# Patient Record
Sex: Male | Born: 1980 | Race: Asian | Hispanic: No | Marital: Married | Smoking: Never smoker
Health system: Southern US, Community
[De-identification: ages and names within clinical notes are randomized; demographics above are authoritative.]

## PROBLEM LIST (undated history)

## (undated) DIAGNOSIS — M1990 Unspecified osteoarthritis, unspecified site: Secondary | ICD-10-CM

## (undated) DIAGNOSIS — E785 Hyperlipidemia, unspecified: Secondary | ICD-10-CM

## (undated) DIAGNOSIS — S5290XA Unspecified fracture of unspecified forearm, initial encounter for closed fracture: Secondary | ICD-10-CM

## (undated) HISTORY — DX: Unspecified fracture of unspecified forearm, initial encounter for closed fracture: S52.90XA

## (undated) HISTORY — DX: Hyperlipidemia, unspecified: E78.5

---
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
## 2002-03-05 ENCOUNTER — Emergency Department (HOSPITAL_COMMUNITY): Admission: EM | Admit: 2002-03-05 | Discharge: 2002-03-05 | Payer: Self-pay | Admitting: Emergency Medicine

## 2007-11-12 ENCOUNTER — Encounter: Admission: RE | Admit: 2007-11-12 | Discharge: 2007-11-12 | Payer: Self-pay | Admitting: Gastroenterology

## 2009-04-04 IMAGING — US US ABDOMEN COMPLETE
1 series · 14 of 25 positions shown · non-contrast
Comparison: None

CLINICAL DATA: Abdomen pain.

ABDOMEN ULTRASOUND
TECHNIQUE: Complete abdominal ultrasound examination was performed
including evaluation of the liver, gallbladder, bile ducts,
pancreas, kidneys, spleen, IVC, and abdominal aorta.

[Series 1: us abdomen complete · 0.28mm/px · 14 of 73 slices shown]
[im 1/73]
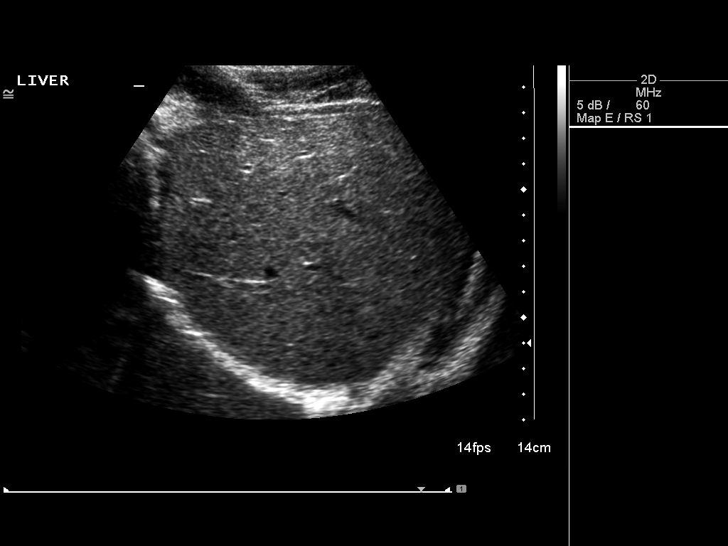
[im 7/73]
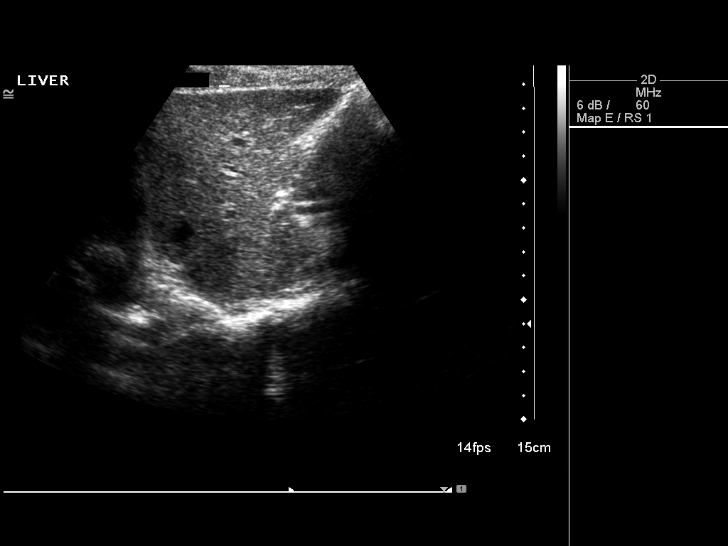
[im 13/73]
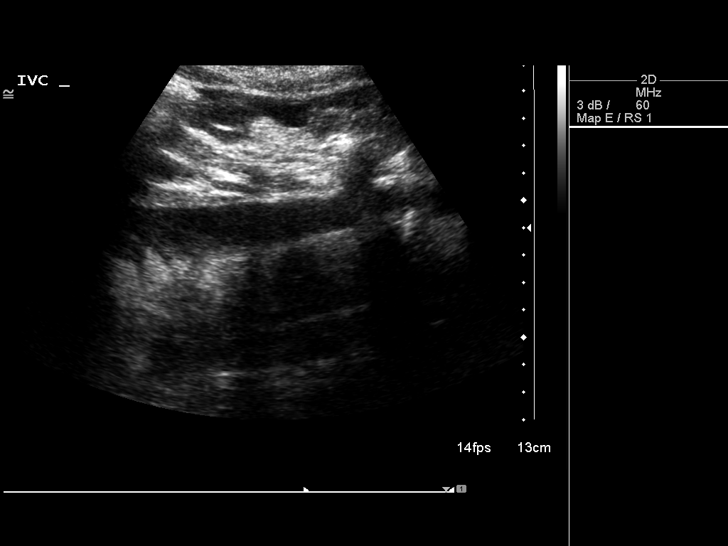
[im 19/73]
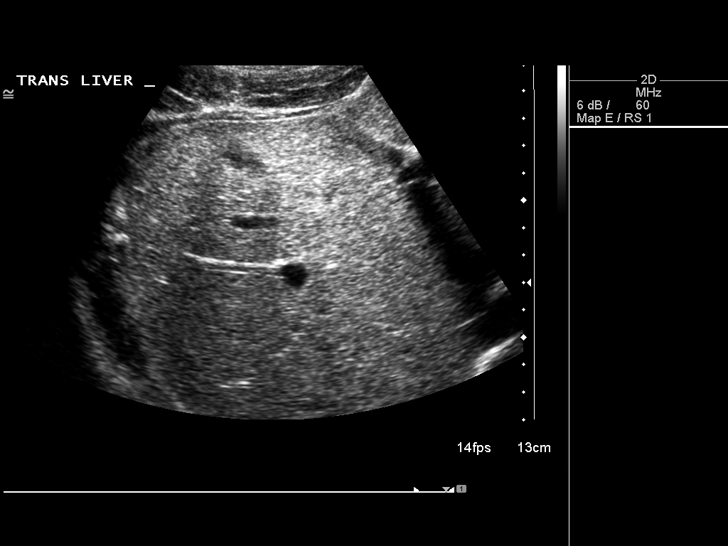
[im 25/73]
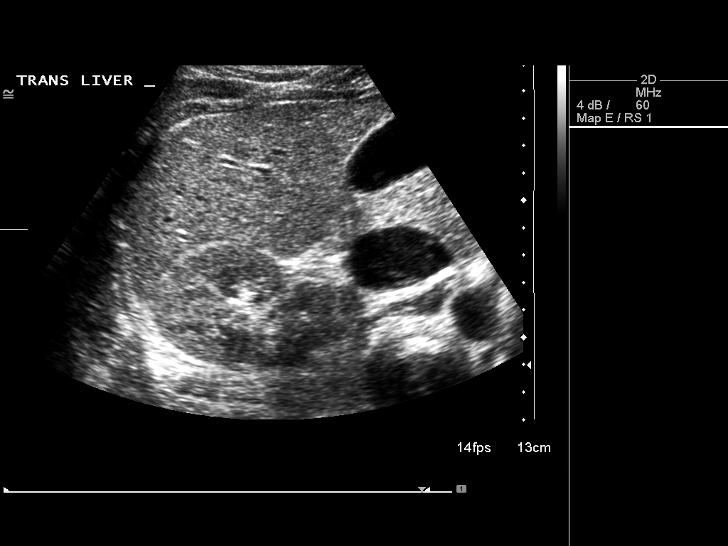
[im 28/73]
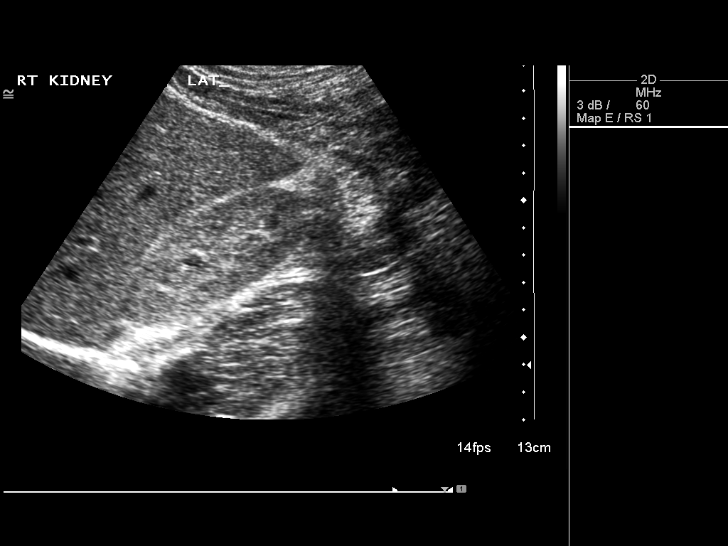
[im 34/73]
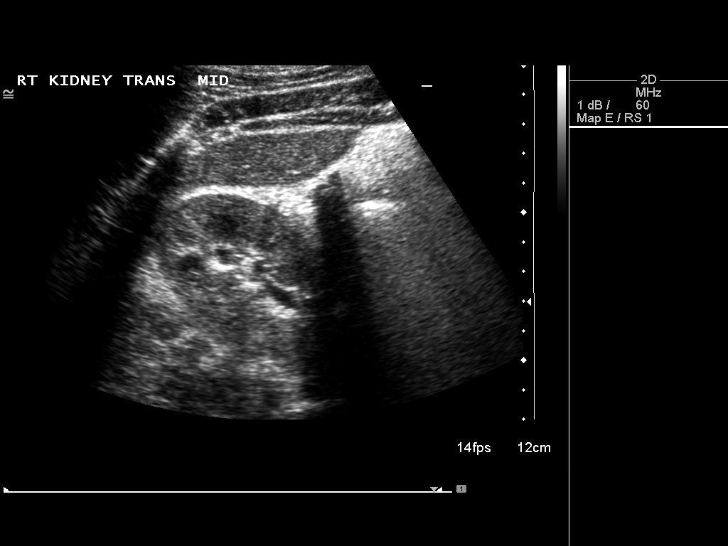
[im 40/73]
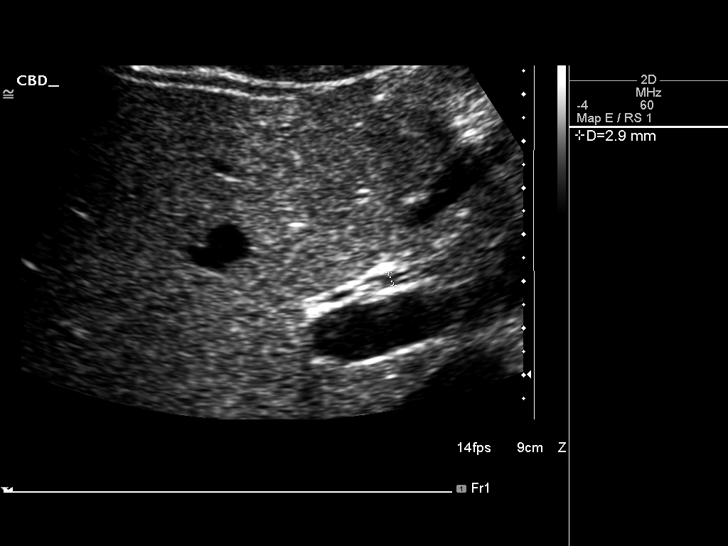
[im 46/73]
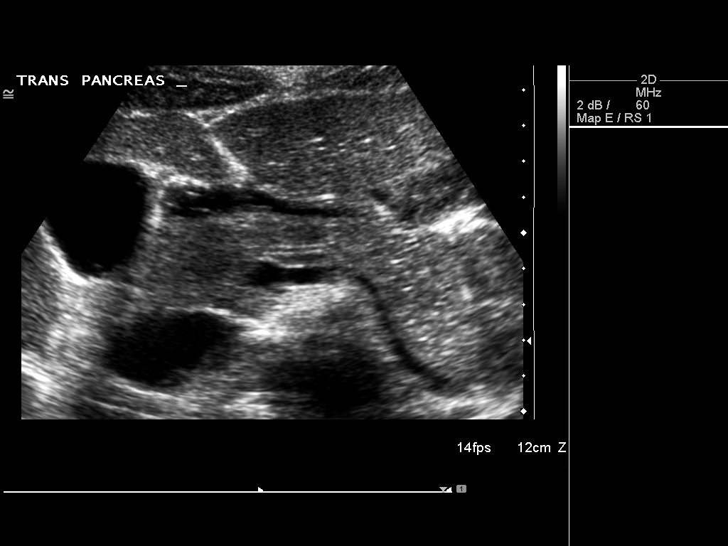
[im 49/73]
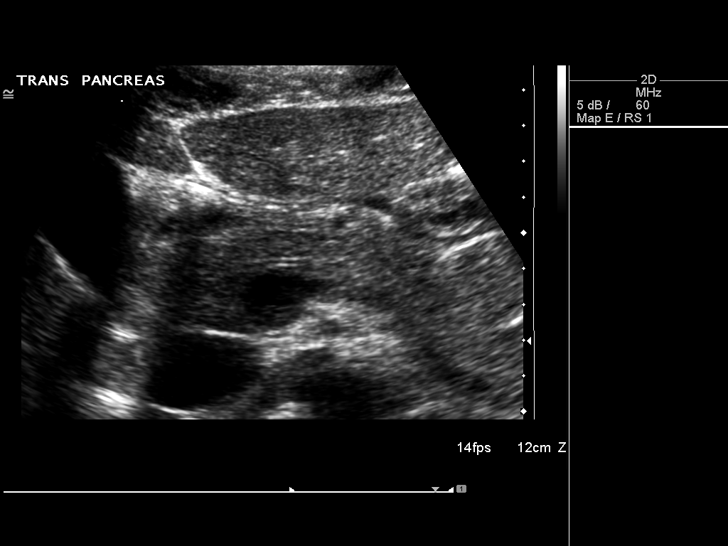
[im 55/73]
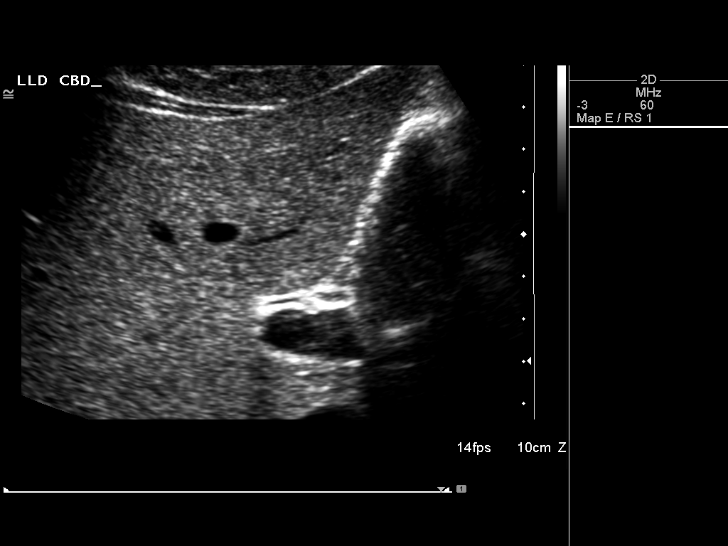
[im 61/73]
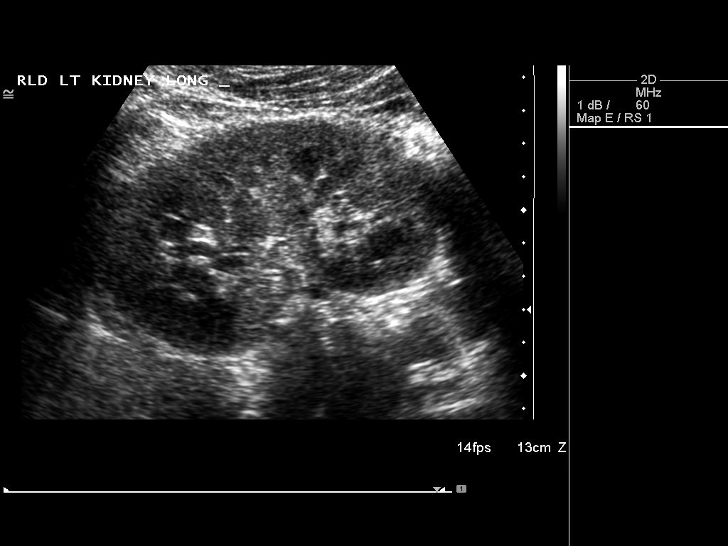
[im 67/73]
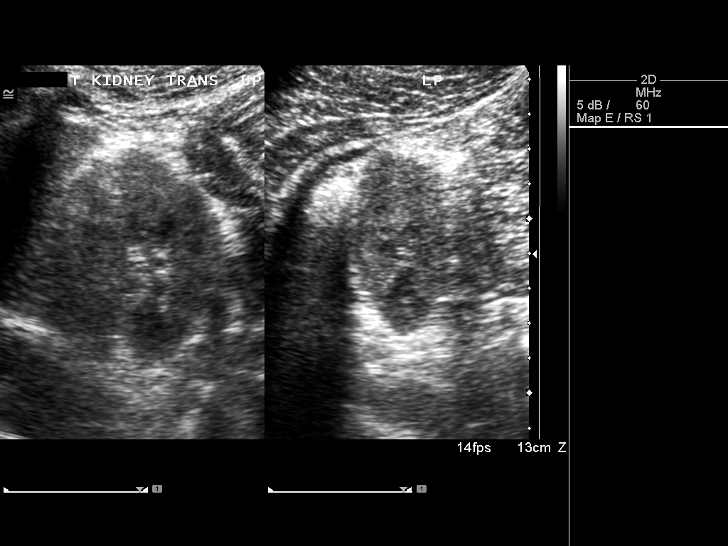
[im 73/73]
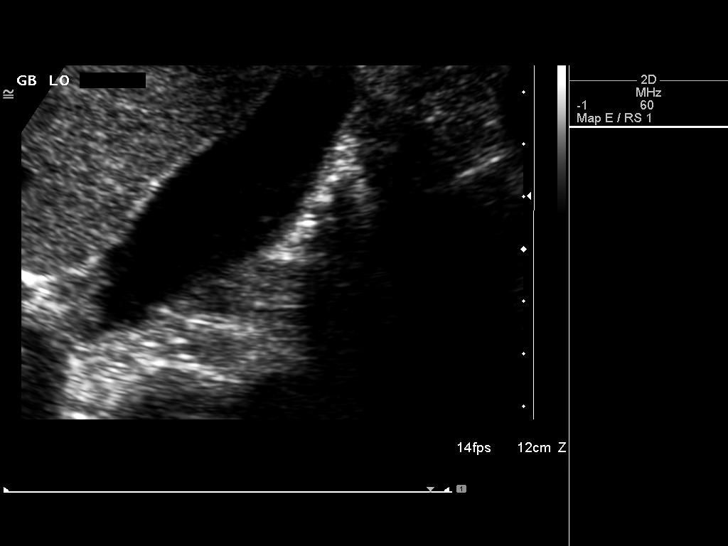

[14 of 25 positions shown; findings below may reference images not displayed]

FINDINGS: There is a 2.5 mm polyp in the gallbladder which does not
shadow or move.  Gallbladder wall is not thickened.  The common
bile duct is 2.9 mm.  The liver IVC pancreas and spleen are normal.
The kidneys and aorta normal.
IMPRESSION: Small gallbladder polyp.  No definite gallstones.

## 2014-04-07 ENCOUNTER — Ambulatory Visit (INDEPENDENT_AMBULATORY_CARE_PROVIDER_SITE_OTHER): Payer: BC Managed Care – PPO | Admitting: Urgent Care

## 2014-04-07 VITALS — BP 110/70 | HR 72 | Temp 98.1°F | Resp 16 | Ht 65.0 in | Wt 170.8 lb

## 2014-04-07 DIAGNOSIS — Z23 Encounter for immunization: Secondary | ICD-10-CM

## 2014-04-07 DIAGNOSIS — L659 Nonscarring hair loss, unspecified: Secondary | ICD-10-CM

## 2014-04-07 DIAGNOSIS — Z0000 Encounter for general adult medical examination without abnormal findings: Secondary | ICD-10-CM

## 2014-04-07 DIAGNOSIS — M79671 Pain in right foot: Secondary | ICD-10-CM

## 2014-04-07 DIAGNOSIS — M25561 Pain in right knee: Secondary | ICD-10-CM

## 2014-04-07 LAB — COMPLETE METABOLIC PANEL WITH GFR
ALK PHOS: 53 U/L (ref 39–117)
ALT: 39 U/L (ref 0–53)
AST: 24 U/L (ref 0–37)
Albumin: 4.5 g/dL (ref 3.5–5.2)
BILIRUBIN TOTAL: 0.6 mg/dL (ref 0.2–1.2)
BUN: 14 mg/dL (ref 6–23)
CO2: 27 mEq/L (ref 19–32)
Calcium: 9.8 mg/dL (ref 8.4–10.5)
Chloride: 102 mEq/L (ref 96–112)
Creat: 1.14 mg/dL (ref 0.50–1.35)
GFR, EST NON AFRICAN AMERICAN: 84 mL/min
GFR, Est African American: >89 mL/min
GLUCOSE: 85 mg/dL (ref 70–99)
Potassium: 4.3 mEq/L (ref 3.5–5.3)
SODIUM: 139 meq/L (ref 135–145)
TOTAL PROTEIN: 7.2 g/dL (ref 6.0–8.3)

## 2014-04-07 LAB — CBC
HEMATOCRIT: 47 % (ref 39.0–52.0)
HEMOGLOBIN: 16.3 g/dL (ref 13.0–17.0)
MCH: 27.5 pg (ref 26.0–34.0)
MCHC: 34.7 g/dL (ref 30.0–36.0)
MCV: 79.4 fL (ref 78.0–100.0)
Platelets: 260 10*3/uL (ref 150–400)
RBC: 5.92 MIL/uL — AB (ref 4.22–5.81)
RDW: 13.8 % (ref 11.5–15.5)
WBC: 7.3 10*3/uL (ref 4.0–10.5)

## 2014-04-07 LAB — LIPID PANEL
CHOL/HDL RATIO: 5.7 ratio
Cholesterol: 250 mg/dL — ABNORMAL HIGH (ref 0–200)
HDL: 44 mg/dL (ref >39)
LDL CALC: 173 mg/dL — AB (ref 0–99)
TRIGLYCERIDES: 165 mg/dL — AB (ref <150)
VLDL: 33 mg/dL (ref 0–40)

## 2014-04-07 LAB — TSH: TSH: 1.205 u[IU]/mL (ref 0.350–4.500)

## 2014-04-07 NOTE — Patient Instructions (Addendum)
Tdap Vaccine (Tetanus, Diphtheria, Pertussis): What You Need to Know 1. Why get vaccinated? Tetanus, diphtheria and pertussis can be very serious diseases, even for adolescents and adults. Tdap vaccine can protect us from these diseases. TETANUS (Lockjaw) causes painful muscle tightening and stiffness, usually all over the body.  It can lead to tightening of muscles in the head and neck so you can't open your mouth, swallow, or sometimes even breathe. Tetanus kills about 1 out of 5 people who are infected. DIPHTHERIA can cause a thick coating to form in the back of the throat.  It can lead to breathing problems, paralysis, heart failure, and death. PERTUSSIS (Whooping Cough) causes severe coughing spells, which can cause difficulty breathing, vomiting and disturbed sleep.  It can also lead to weight loss, incontinence, and rib fractures. Up to 2 in 100 adolescents and 5 in 100 adults with pertussis are hospitalized or have complications, which could include pneumonia or death. These diseases are caused by bacteria. Diphtheria and pertussis are spread from person to person through coughing or sneezing. Tetanus enters the body through cuts, scratches, or wounds. Before vaccines, the [place] saw as many as 200,000 cases a year of diphtheria and pertussis, and hundreds of cases of tetanus. Since vaccination began, tetanus and diphtheria have dropped by about 99% and pertussis by about 80%. 2. Tdap vaccine Tdap vaccine can protect adolescents and adults from tetanus, diphtheria, and pertussis. One dose of Tdap is routinely given at age 11 or 12. People who did not get Tdap at that age should get it as soon as possible. Tdap is especially important for health care professionals and anyone having close contact with a baby younger than 12 months. Pregnant women should get a dose of Tdap during every pregnancy, to protect the newborn from pertussis. Infants are most at risk for severe, life-threatening  complications from pertussis. A similar vaccine, called Td, protects from tetanus and diphtheria, but not pertussis. A Td booster should be given every 10 years. Tdap may be given as one of these boosters if you have not already gotten a dose. Tdap may also be given after a severe cut or burn to prevent tetanus infection. Your doctor can give you more information. Tdap may safely be given at the same time as other vaccines. 3. Some people should not get this vaccine  If you ever had a life-threatening allergic reaction after a dose of any tetanus, diphtheria, or pertussis containing vaccine, OR if you have a severe allergy to any part of this vaccine, you should not get Tdap. Tell your doctor if you have any severe allergies.  If you had a coma, or long or multiple seizures within 7 days after a childhood dose of DTP or DTaP, you should not get Tdap, unless a cause other than the vaccine was found. You can still get Td.  Talk to your doctor if you:  have epilepsy or another nervous system problem,  had severe pain or swelling after any vaccine containing diphtheria, tetanus or pertussis,  ever had Guillain-Barr Syndrome (GBS),  aren't feeling well on the day the shot is scheduled. 4. Risks of a vaccine reaction With any medicine, including vaccines, there is a chance of side effects. These are usually mild and go away on their own, but serious reactions are also possible. Brief fainting spells can follow a vaccination, leading to injuries from falling. Sitting or lying down for about 15 minutes can help prevent these. Tell your doctor if you feel   dizzy or light-headed, or have vision changes or ringing in the ears. Mild problems following Tdap (Did not interfere with activities)  Pain where the shot was given (about 3 in 4 adolescents or 2 in 3 adults)  Redness or swelling where the shot was given (about 1 person in 5)  Mild fever of at least 100.48F (up to about 1 in 25 adolescents or  1 in 100 adults)  Headache (about 3 or 4 people in 10)  Tiredness (about 1 person in 3 or 4)  Nausea, vomiting, diarrhea, stomach ache (up to 1 in 4 adolescents or 1 in 10 adults)  Chills, body aches, sore joints, rash, swollen glands (uncommon) Moderate problems following Tdap (Interfered with activities, but did not require medical attention)  Pain where the shot was given (about 1 in 5 adolescents or 1 in 100 adults)  Redness or swelling where the shot was given (up to about 1 in 16 adolescents or 1 in 25 adults)  Fever over 102F (about 1 in 100 adolescents or 1 in 250 adults)  Headache (about 3 in 20 adolescents or 1 in 10 adults)  Nausea, vomiting, diarrhea, stomach ache (up to 1 or 3 people in 100)  Swelling of the entire arm where the shot was given (up to about 3 in 100). Severe problems following Tdap (Unable to perform usual activities; required medical attention)  Swelling, severe pain, bleeding and redness in the arm where the shot was given (rare). A severe allergic reaction could occur after any vaccine (estimated less than 1 in a million doses). 5. What if there is a serious reaction? What should I look for?  Look for anything that concerns you, such as signs of a severe allergic reaction, very high fever, or behavior changes. Signs of a severe allergic reaction can include hives, swelling of the face and throat, difficulty breathing, a fast heartbeat, dizziness, and weakness. These would start a few minutes to a few hours after the vaccination. What should I do?  If you think it is a severe allergic reaction or other emergency that can't wait, call 9-1-1 or get the person to the nearest hospital. Otherwise, call your doctor.  Afterward, the reaction should be reported to the "Vaccine Adverse Event Reporting System" (VAERS). Your doctor might file this report, or you can do it yourself through the VAERS web site at www.vaers.LAgents.no, or by calling  1-409-248-7482. VAERS is only for reporting reactions. They do not give medical advice.  6. The National Vaccine Injury Compensation Program The [employer] Vaccine Injury Compensation Program (VICP) is a federal program that was created to compensate people who may have been injured by certain vaccines. Persons who believe they may have been injured by a vaccine can learn about the program and about filing a claim by calling 1-361-718-2286 or visiting the VICP website at SpiritualWord.at. 7. How can I learn more?  Ask your doctor.  Call your local or state health department.  Contact the Centers for Disease Control and Prevention (CDC):  Call 838-570-7683 or visit CDC's website at PicCapture.uy. CDC Tdap Vaccine VIS (10/30/11) Document Released: 12/09/2011 Document Revised: 10/24/2013 Document Reviewed: 09/21/2013 Exit[employer] Patient Information 2015 [place], [place]. This information is not intended to replace advice given to you by your health care provider. Make sure you discuss any questions you have with your health care provider.   Influenza Vaccine (Flu Vaccine, Inactivated or Recombinant) 2014-2015: What You Need to Know 1. Why get vaccinated? Influenza ("flu") is a contagious disease  that spreads around the [place] every winter, usually between October and May. Flu is caused by influenza viruses, and is spread mainly by coughing, sneezing, and close contact. Anyone can get flu, but the risk of getting flu is highest among children. Symptoms come on suddenly and may last several days. They can include:  fever/chills  sore throat  muscle aches  fatigue  cough  headache  runny or stuffy nose Flu can make some people much sicker than others. These people include young children, people 7165 and older, pregnant women, and people with certain health conditions-such as heart, lung or kidney disease, nervous system disorders, or a weakened immune system.  Flu vaccination is especially important for these people, and anyone in close contact with them. Flu can also lead to pneumonia, and make existing medical conditions worse. It can cause diarrhea and seizures in children. Each year thousands of people in the [place] die from flu, and many more are hospitalized. Flu vaccine is the best protection against flu and its complications. Flu vaccine also helps prevent spreading flu from person to person. 2. Inactivated and recombinant flu vaccines You are getting an injectable flu vaccine, which is either an "inactivated" or "recombinant" vaccine. These vaccines do not contain any live influenza virus. They are given by injection with a needle, and often called the "flu shot."  A different live, attenuated (weakened) influenza vaccine is sprayed into the nostrils. This vaccine is described in a separate Vaccine Information Statement. Flu vaccination is recommended every year. Some children 6 months through 608 years of age might need two doses during one year. Flu viruses are always changing. Each year's flu vaccine is made to protect against 3 or 4 viruses that are likely to cause disease that year. Flu vaccine cannot prevent all cases of flu, but it is the best defense against the disease.  It takes about 2 weeks for protection to develop after the vaccination, and protection lasts several months to a year. Some illnesses that are not caused by influenza virus are often mistaken for flu. Flu vaccine will not prevent these illnesses. It can only prevent influenza. Some inactivated flu vaccine contains a very small amount of a mercury-based preservative called thimerosal. Studies have shown that thimerosal in vaccines is not harmful, but flu vaccines that do not contain a preservative are available. 3. Some people should not get this vaccine Tell the person who gives you the vaccine:  If you have any severe, life-threatening allergies. If you ever had a  life-threatening allergic reaction after a dose of flu vaccine, or have a severe allergy to any part of this vaccine, including (for example) an allergy to gelatin, antibiotics, or eggs, you may be advised not to get vaccinated. Most, but not all, types of flu vaccine contain a small amount of egg protein.  If you ever had Guillain-Barr Syndrome (a severe paralyzing illness, also called GBS). Some people with a history of GBS should not get this vaccine. This should be discussed with your doctor.  If you are not feeling well. It is usually okay to get flu vaccine when you have a mild illness, but you might be advised to wait until you feel better. You should come back when you are better. 4. Risks of a vaccine reaction With a vaccine, like any medicine, there is a chance of side effects. These are usually mild and go away on their own. Problems that could happen after any vaccine:  Brief fainting spells  can happen after any medical procedure, including vaccination. Sitting or lying down for about 15 minutes can help prevent fainting, and injuries caused by a fall. Tell your doctor if you feel dizzy, or have vision changes or ringing in the ears.  Severe shoulder pain and reduced range of motion in the arm where a shot was given can happen, very rarely, after a vaccination.  Severe allergic reactions from a vaccine are very rare, estimated at less than 1 in a million doses. If one were to occur, it would usually be within a few minutes to a few hours after the vaccination. Mild problems following inactivated flu vaccine:  soreness, redness, or swelling where the shot was given  hoarseness  sore, red or itchy eyes  cough  fever  aches  headache  itching  fatigue If these problems occur, they usually begin soon after the shot and last 1 or 2 days. Moderate problems following inactivated flu vaccine:  Young children who get inactivated flu vaccine and pneumococcal vaccine (PCV13) at  the same time may be at increased risk for seizures caused by fever. Ask your doctor for more information. Tell your doctor if a child who is getting flu vaccine has ever had a seizure. Inactivated flu vaccine does not contain live flu virus, so you cannot get the flu from this vaccine. As with any medicine, there is a very remote chance of a vaccine causing a serious injury or death. The safety of vaccines is always being monitored. For more information, visit: http://floyd.org/ 5. What if there is a serious reaction? What should I look for?  Look for anything that concerns you, such as signs of a severe allergic reaction, very high fever, or behavior changes. Signs of a severe allergic reaction can include hives, swelling of the face and throat, difficulty breathing, a fast heartbeat, dizziness, and weakness. These would start a few minutes to a few hours after the vaccination. What should I do?  If you think it is a severe allergic reaction or other emergency that can't wait, call 9-1-1 and get the person to the nearest hospital. Otherwise, call your doctor.  Afterward, the reaction should be reported to the Vaccine Adverse Event Reporting System (VAERS). Your doctor should file this report, or you can do it yourself through the VAERS web site at www.vaers.LAgents.no, or by calling 1-727-253-8605. VAERS does not give medical advice. 6. The National Vaccine Injury Compensation Program The [employer] Vaccine Injury Compensation Program (VICP) is a federal program that was created to compensate people who may have been injured by certain vaccines. Persons who believe they may have been injured by a vaccine can learn about the program and about filing a claim by calling 1-(226)495-5955 or visiting the VICP website at SpiritualWord.at. There is a time limit to file a claim for compensation. 7. How can I learn more?  Ask your health care provider.  Call your local or state  health department.  Contact the Centers for Disease Control and Prevention (CDC):  Call 830-696-7537 (1-800-CDC-INFO) or  Visit CDC's website at BiotechRoom.com.cy CDC Vaccine Information Statement (Interim) Inactivated Influenza Vaccine (02/08/2013) Document Released: 04/03/2006 Document Revised: 10/24/2013 Document Reviewed: 05/27/2013 [facility] Patient Information 2015 [place], [place]. This information is not intended to replace advice given to you by your health care provider. Make sure you discuss any questions you have with your health care provider.    Plantar Fasciitis Plantar fasciitis is a common condition that causes foot pain. It is soreness (inflammation) of  the band of tough fibrous tissue on the bottom of the foot that runs from the heel bone (calcaneus) to the ball of the foot. The cause of this soreness may be from excessive standing, poor fitting shoes, running on hard surfaces, being overweight, having an abnormal walk, or overuse (this is common in runners) of the painful foot or feet. It is also common in aerobic exercise dancers and ballet dancers. SYMPTOMS  Most people with plantar fasciitis complain of:  Severe pain in the morning on the bottom of their foot especially when taking the first steps out of bed. This pain recedes after a few minutes of walking.  Severe pain is experienced also during walking following a long period of inactivity.  Pain is worse when walking barefoot or up stairs DIAGNOSIS   Your caregiver will diagnose this condition by examining and feeling your foot.  Special tests such as X-rays of your foot, are usually not needed. PREVENTION   Consult a sports medicine professional before beginning a new exercise program.  Walking programs offer a good workout. With walking there is a lower chance of overuse injuries common to runners. There is less impact and less jarring of the joints.  Begin all new exercise programs slowly. If problems or  pain develop, decrease the amount of time or distance until you are at a comfortable level.  Wear good shoes and replace them regularly.  Stretch your foot and the heel cords at the back of the ankle (Achilles tendon) both before and after exercise.  Run or exercise on even surfaces that are not hard. For example, asphalt is better than pavement.  Do not run barefoot on hard surfaces.  If using a treadmill, vary the incline.  Do not continue to workout if you have foot or joint problems. Seek professional help if they do not improve. HOME CARE INSTRUCTIONS   Avoid activities that cause you pain until you recover.  Use ice or cold packs on the problem or painful areas after working out.  Only take over-the-counter or prescription medicines for pain, discomfort, or fever as directed by your caregiver.  Soft shoe inserts or athletic shoes with air or gel sole cushions may be helpful.  If problems continue or become more severe, consult a sports medicine caregiver or your own health care provider. Cortisone is a potent anti-inflammatory medication that may be injected into the painful area. You can discuss this treatment with your caregiver. MAKE SURE YOU:   Understand these instructions.  Will watch your condition.  Will get help right away if you are not doing well or get worse. Document Released: 03/04/2001 Document Revised: 09/01/2011 Document Reviewed: 05/03/2008 [facility] Patient Information 2015 [place]. This information is not intended to replace advice given to you by your health care provider. Make sure you discuss any questions you have with your health care provider.

## 2014-04-07 NOTE — Progress Notes (Signed)
Discussed patient, PE, A/P with [name]. Agree.

## 2014-04-07 NOTE — Progress Notes (Signed)
Subjective:    Patient ID: Parker Rogers, male    DOB: 08-Mar-1981, 33 y.o.   MRN: [id number]  HPI  Parker Rogers is presenting as a new patient today to establish care at [facility]. Patient states that he is in very good health, does not have any chronic conditions and is not on any medications. He has not had an annual exam in several years. Does not wear glasses or contacts, no visual deficits, has not needed eye care. He does get dental cleanings yearly, last visit about 1 year ago. Due for another visit.  Immunizations: Plans on updating his TDap and getting flu vaccine today. Otherwise, patient states he is up to date.  Concerns for today: Hair thinning - states that his his hair has been thinning for the past several weeks. Denies hair loss, receeding hairline or bald spots. He has previously never taken anything for his hair thinning. Denies any family history of male-pattern baldness. ROS as below. He would like to have recommendations regarding medications to prevent baldness.   Knee and heel pain - since May 2015, he has had right heel pain worse in the morning and after running long distances. He has had this heel pain before in his left foot, resolved with stretching and icing after 2 months. Currently uses soles in his shoes but does not feel any significant relief from his heel pain. He also has intermittent right knee pain after strenuous running including sprinting and long distance. Usually resolves after a few days. No swelling or erythema around joint. Denies decreased ROM, sensation, strength for knee or heel. ROS as below. No other aggravating or relieving factors.  Denies any current medications.  No known food or drug allergies.   Past Medical History  Diagnosis Date  . Fracture of forearm, closed 1999    left arm    Past Surgical History  Procedure Laterality Date  . Fracture surgery  1999    Left arm  . Closed reduction forearm fracture  1999    left forearm     History   Social History  . Marital Status: Married    Spouse Name: N/A    Number of Children: N/A  . Years of Education: N/A   Occupational History  . Not on file.   Social History Main Topics  . Smoking status: Never Smoker   . Smokeless tobacco: Not on file  . Alcohol Use: 0.5 oz/week    1 drink(s) per week     Comment: socially  . Drug Use: No  . Sexual Activity: Yes   Other Topics Concern  . Not on file   Social History Narrative   Married 02/2014, just came back from his honeymoon. No children yet. 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 Eats healthy and exercises regularly.    Review of Systems  Constitutional: Negative for fever, chills, fatigue and unexpected weight change.  HENT: Negative for ear pain, hearing loss, sore throat and trouble swallowing.   Eyes: Negative for pain and visual disturbance.  Respiratory: Negative for cough, chest tightness and shortness of breath.   Cardiovascular: Negative for chest pain and palpitations.  Gastrointestinal: Negative for nausea, vomiting, diarrhea, constipation and blood in stool.  Endocrine: Negative for polydipsia, polyphagia and polyuria.  Genitourinary: Negative for dysuria, hematuria, flank pain, scrotal swelling, difficulty urinating and testicular pain.  Musculoskeletal: Positive for arthralgias (as in HPI). Negative for back pain.  Skin: Negative for rash.  Allergic/Immunologic:  Negative for food allergies.  Neurological: Negative for dizziness, light-headedness and headaches.  Psychiatric/Behavioral: Negative for sleep disturbance and dysphoric mood.      Objective:   Physical Exam  Constitutional:  BP 110/70  Pulse 72  Temp(Src) 98.1 F (36.7 C) (Oral)  Resp 16  Ht 5\' 5"  (1.651 m)  Wt 170 lb 12.8 oz (77.474 kg)  BMI 28.42 kg/m2  SpO2 100%    BP 110/70  Pulse 72  Temp(Src) 98.1 F (36.7 C) (Oral)  Resp 16  Ht 5\' 5"  (1.651 m)  Wt 170 lb 12.8 oz  (77.474 kg)  BMI 28.42 kg/m2  SpO2 100%  General Appearance:    Alert, cooperative, no distress, appears stated age  Head:    Normocephalic, without obvious abnormality, atraumatic  Eyes:    PERRL, conjunctiva/corneas clear, EOM's intact, fundi    benign, both eyes       Ears:    Normal TM's and external ear canals, both ears  Nose:   Nares normal, septum midline, mucosa normal, no drainage    or sinus tenderness  Throat:   Lips, mucosa, and tongue normal; teeth and gums normal  Neck:   Supple, symmetrical, trachea midline, no adenopathy;       thyroid:  No enlargement/tenderness/nodules; no carotid   bruit or JVD  Back:     Symmetric, no curvature, ROM normal, no CVA tenderness  Lungs:     Clear to auscultation bilaterally, respirations unlabored  Chest wall:    No tenderness or deformity  Heart:    Regular rate and rhythm, S1 and S2 normal, no murmur, rub   or gallop  Abdomen:     Soft, non-tender, bowel sounds active all four quadrants,    no masses, no organomegaly  Genitalia:    Normal male without lesion, discharge or tenderness  Rectal:    Patient declined.  Extremities:   Extremities normal, atraumatic, no cyanosis or edema  Pulses:   2+ and symmetric all extremities  Skin:   Skin color, texture, turgor normal, no rashes or lesions  Lymph nodes:   Cervical, supraclavicular, and axillary nodes normal  Neurologic:   CNII-XII intact. Normal strength, sensation and reflexes      throughout       Assessment & Plan:   Parker Rogers is a 33 y.o. male presenting as a new patient to establish care. Patient is a young male, recently married, in excellent health. Discussed healthy lifestyle, diet, exercise, preventative care, vaccinations, and addressed his concerns. Plan for follow up in 1 year. Otherwise, plan for specific conditions below.  1. Physical exam, annual Baseline labs today - COMPLETE METABOLIC PANEL WITH GFR - CBC - Lipid panel - TSH  2. Hair thinning No obvious  abnormalities found on PE, TSH level today, advised patient that he could use OTC Rogaine, monitor  3. Heel pain, right 4. Right knee pain Likely due to plantar fascitis and overuse, Rx heel splint for 4-6 weeks nightly, Alleve for pain, advised rest, ice (immediately after exercise), compression and elevation, switch from running to biking or swimming for exercise until symptoms resolve, follow up if pain worsens or no resolution  5. Flu vaccine need 6. Need for diphtheria-tetanus-pertussis (Tdap) vaccine, adult/adolescent - Flu Vaccine QUAD 36+ mos IM - Tdap vaccine greater than or equal to 7yo IM   Wallis BambergMario Rukaya Kleinschmidt, PA-C [facility] [phone] 04/07/2014 11:44 AM

## 2014-04-11 ENCOUNTER — Telehealth: Payer: Self-pay | Admitting: Urgent Care

## 2014-04-11 NOTE — Telephone Encounter (Signed)
Attempted phone call to patient's cell phone. Also requested that patient set up mychart account to forward results more efficiently. Will call again at a later time.  Wallis BambergMario Mumtaz Lovins, [place]-C 8:43 AM 04/11/2014

## 2014-04-12 ENCOUNTER — Telehealth: Payer: Self-pay | Admitting: Urgent Care

## 2014-04-12 ENCOUNTER — Encounter: Payer: Self-pay | Admitting: Urgent Care

## 2014-04-12 NOTE — Telephone Encounter (Signed)
Discussed lab results with patient including dietary modifications and continued exercise. Will hold off on starting cholesterol lowering medications. Recheck lipid panel at next annual physical exam.

## 2014-06-23 HISTORY — PX: WISDOM TOOTH EXTRACTION: SHX21

## 2014-10-14 ENCOUNTER — Ambulatory Visit (INDEPENDENT_AMBULATORY_CARE_PROVIDER_SITE_OTHER): Payer: BLUE CROSS/BLUE SHIELD | Admitting: Emergency Medicine

## 2014-10-14 VITALS — BP 102/70 | HR 57 | Temp 98.0°F | Resp 14 | Ht 66.0 in | Wt 170.0 lb

## 2014-10-14 DIAGNOSIS — H0016 Chalazion left eye, unspecified eyelid: Secondary | ICD-10-CM | POA: Diagnosis not present

## 2014-10-14 MED ORDER — POLYMYXIN B-TRIMETHOPRIM 10000-0.1 UNIT/ML-% OP SOLN: 2.0000 [drp] | OPHTHALMIC | Status: DC

## 2014-10-14 NOTE — Progress Notes (Signed)
[facility] [address], [place] [id number] [phone]  Date:  10/14/2014   Name:  Parker Rogers   DOB:  06/05/1981   MRN:  [id number]  PCP:  No PCP Per Patient    Chief Complaint: Eye Problem   History of Present Illness:  Parker Rogers is a 34 y.o. very pleasant male patient who presents with the following:  Noticed swelling in left upper lid associated with itching on Thursday. No discharge or gluing No FB sensation No injury or vision symptoms No coryza No fever or chills No improvement with over the counter medications or other home remedies.  Denies other complaint or health concern today.    There are no active problems to display for this patient.   Past Medical History  Diagnosis Date  . Fracture of forearm, closed 1999    left arm    Past Surgical History  Procedure Laterality Date  . Fracture surgery  1999    Left arm  . Closed reduction forearm fracture  1999    left forearm    History  Substance Use Topics  . Smoking status: Never Smoker   . Smokeless tobacco: Not on file  . Alcohol Use: 0.5 oz/week    1 drink(s) per week     Comment: socially    Family History  Problem Relation Age of Onset  . Hyperlipidemia Father     No Known Allergies  Medication list has been reviewed and updated.  No current outpatient prescriptions on file prior to visit.   No current facility-administered medications on file prior to visit.    Review of Systems:  As per HPI, otherwise negative.    Physical Examination: Filed Vitals:   10/14/14 0925  BP: 102/70  Pulse: 57  Temp: 98 F (36.7 C)  Resp: 14   Filed Vitals:   10/14/14 0925  Height: 5\' 6"  (1.676 m)  Weight: 170 lb (77.111 kg)   Body mass index is 27.45 kg/(m^2). Ideal Body Weight: Weight in (lb) to have BMI = 25: 154.6   GEN: WDWN, NAD, Non-toxic, Alert & Oriented x 3 HEENT: Atraumatic, Normocephalic.  Ears and Nose: No external deformity. EXTR: No  clubbing/cyanosis/edema NEURO: Normal gait.  PSYCH: Normally interactive. Conversant. Not depressed or anxious appearing.  Calm demeanor.  LEFT eye;  Swelling upper lid.  Unable to evert. PRRERLA EOMI no injection.  No FB   Assessment and Plan: Chalazion ophth trimeth drops  Signed,  Phillips OdorJeffery Anderson, MD

## 2014-10-14 NOTE — Patient Instructions (Signed)
Chalazion A chalazion is a swelling or hard lump on the eyelid caused by a blocked oil gland. Chalazions may occur on the upper or the lower eyelid.  CAUSES  Oil gland in the eyelid becomes blocked. SYMPTOMS   Swelling or hard lump on the eyelid. This lump may make it hard to see out of the eye.  The swelling may spread to areas around the eye. TREATMENT   Although some chalazions disappear by themselves in 1 or 2 months, some chalazions may need to be removed.  Medicines to treat an infection may be required. HOME CARE INSTRUCTIONS   Wash your hands often and dry them with a clean towel. Do not touch the chalazion.  Apply heat to the eyelid several times a day for 10 minutes to help ease discomfort and bring any yellowish white fluid (pus) to the surface. One way to apply heat to a chalazion is to use the handle of a metal spoon.  Hold the handle under hot water until it is hot, and then wrap the handle in paper towels so that the heat can come through without burning your skin.  Hold the wrapped handle against the chalazion and reheat the spoon handle as needed.  Apply heat in this fashion for 10 minutes, 4 times per day.  Return to your caregiver to have the pus removed if it does not break (rupture) on its own.  Do not try to remove the pus yourself by squeezing the chalazion or sticking it with a pin or needle.  Only take over-the-counter or prescription medicines for pain, discomfort, or fever as directed by your caregiver. SEEK IMMEDIATE MEDICAL CARE IF:   You have pain in your eye.  Your vision changes.  The chalazion does not go away.  The chalazion becomes painful, red, or swollen, grows larger, or does not start to disappear after 2 weeks. MAKE SURE YOU:   Understand these instructions.  Will watch your condition.  Will get help right away if you are not doing well or get worse. Document Released: 06/06/2000 Document Revised: 09/01/2011 Document Reviewed:  09/24/2009 ExitCare Patient Information 2015 [employer]. This information is not intended to replace advice given to you by your health care provider. Make sure you discuss any questions you have with your health care provider.  

## 2014-10-23 ENCOUNTER — Telehealth: Payer: Self-pay

## 2014-10-23 NOTE — Telephone Encounter (Signed)
Patient wants a return call from referrals about his upcoming appointment.  [phone]

## 2015-03-13 ENCOUNTER — Encounter: Payer: BLUE CROSS/BLUE SHIELD | Admitting: Family Medicine

## 2015-04-11 ENCOUNTER — Encounter: Payer: Self-pay | Admitting: Urgent Care

## 2015-04-11 ENCOUNTER — Ambulatory Visit (INDEPENDENT_AMBULATORY_CARE_PROVIDER_SITE_OTHER): Payer: BLUE CROSS/BLUE SHIELD | Admitting: Urgent Care

## 2015-04-11 VITALS — BP 110/73 | HR 62 | Temp 98.7°F | Resp 16 | Ht 65.75 in | Wt 166.4 lb

## 2015-04-11 DIAGNOSIS — Z0000 Encounter for general adult medical examination without abnormal findings: Secondary | ICD-10-CM | POA: Diagnosis not present

## 2015-04-11 LAB — CBC
HCT: 47.8 % (ref 39.0–52.0)
HEMOGLOBIN: 16.6 g/dL (ref 13.0–17.0)
MCH: 28.3 pg (ref 26.0–34.0)
MCHC: 34.7 g/dL (ref 30.0–36.0)
MCV: 81.6 fL (ref 78.0–100.0)
MPV: 10.3 fL (ref 8.6–12.4)
PLATELETS: 254 10*3/uL (ref 150–400)
RBC: 5.86 MIL/uL — AB (ref 4.22–5.81)
RDW: 13.5 % (ref 11.5–15.5)
WBC: 5.2 10*3/uL (ref 4.0–10.5)

## 2015-04-11 LAB — COMPREHENSIVE METABOLIC PANEL
ALBUMIN: 4.7 g/dL (ref 3.6–5.1)
ALT: 34 U/L (ref 9–46)
AST: 25 U/L (ref 10–40)
Alkaline Phosphatase: 54 U/L (ref 40–115)
BILIRUBIN TOTAL: 0.8 mg/dL (ref 0.2–1.2)
BUN: 18 mg/dL (ref 7–25)
CALCIUM: 9.8 mg/dL (ref 8.6–10.3)
CHLORIDE: 100 mmol/L (ref 98–110)
CO2: 30 mmol/L (ref 20–31)
Creat: 1.21 mg/dL (ref 0.60–1.35)
GLUCOSE: 85 mg/dL (ref 65–99)
POTASSIUM: 4.9 mmol/L (ref 3.5–5.3)
Sodium: 138 mmol/L (ref 135–146)
Total Protein: 7.4 g/dL (ref 6.1–8.1)

## 2015-04-11 LAB — LIPID PANEL
CHOL/HDL RATIO: 4.4 ratio (ref <=5.0)
Cholesterol: 213 mg/dL — ABNORMAL HIGH (ref 125–200)
HDL: 48 mg/dL (ref >=40)
LDL CALC: 131 mg/dL — AB (ref <130)
TRIGLYCERIDES: 172 mg/dL — AB (ref <150)
VLDL: 34 mg/dL — ABNORMAL HIGH (ref <30)

## 2015-04-11 LAB — TSH: TSH: 1.228 u[IU]/mL (ref 0.350–4.500)

## 2015-04-11 NOTE — Patient Instructions (Signed)

## 2015-04-11 NOTE — Progress Notes (Signed)
MRN: [id number]  Subjective:   Parker Rogers is a 34 y.o. male presenting for annual physical exam.  Medical care team includes: PCP: No PCP Per Patient  Specialists: None   Yaniv does not have any active problems on his problem list.  [name] is married since 2015, just had his anniversary 02/2015, went hiking and enjoys doing outdoor activities. Plans on mountain biking more often. He works as an Art gallery manager. Life is good at home and work. Eats healthily, denies smoking cigarettes or drinking alcohol.  Rithwik has a current medication list which includes the following prescription(s): glucosamine hcl, multivitamin, and fish oil. He has No Known Allergies.  Kaicen  has a past medical history of Fracture of forearm, closed (1999). Also  has past surgical history that includes Closed reduction forearm fracture (1999); Fracture surgery (1999); and Wisdom tooth extraction (2016).  His family history includes Hyperlipidemia in his father.  Immunizations: Flu shot updated this year through his work, TDAP 03/2014  Review of Systems  Constitutional: Negative for fever, chills, weight loss, malaise/fatigue and diaphoresis.  HENT: Negative for congestion, ear discharge, ear pain, hearing loss, nosebleeds, sore throat and tinnitus.   Eyes: Negative for blurred vision, double vision, photophobia, pain, discharge and redness.  Respiratory: Negative for cough, shortness of breath and wheezing.   Cardiovascular: Negative for chest pain, palpitations and leg swelling.  Gastrointestinal: Negative for nausea, vomiting, abdominal pain, diarrhea, constipation and blood in stool.  Genitourinary: Negative for dysuria, urgency, frequency, hematuria and flank pain.  Musculoskeletal: Negative for myalgias, back pain and joint pain.  Skin: Negative for itching and rash.  Neurological: Negative for dizziness, tingling, seizures, loss of consciousness, weakness and headaches.  Endo/Heme/Allergies:  Negative for polydipsia.  Psychiatric/Behavioral: Negative for depression, suicidal ideas, hallucinations, memory loss and substance abuse. The patient is not nervous/anxious and does not have insomnia.    Objective:   Vitals: BP 110/73 mmHg  Pulse 62  Temp(Src) 98.7 F (37.1 C) (Oral)  Resp 16  Ht 5' 5.75" (1.67 m)  Wt 166 lb 6.4 oz (75.479 kg)  BMI 27.06 kg/m2  Physical Exam  Constitutional: He is oriented to person, place, and time. He appears well-developed and well-nourished.  HENT:  TM's intact bilaterally, no effusions or erythema. Nares patent, nasal turbinates pink and moist, nasal passages patent. No sinus tenderness. Oropharynx clear, mucous membranes moist, dentition in good repair.  Eyes: Conjunctivae and EOM are normal. Pupils are equal, round, and reactive to light. Right eye exhibits no discharge. Left eye exhibits no discharge. No scleral icterus.  Neck: Normal range of motion. Neck supple. No thyromegaly present.  Cardiovascular: Normal rate, regular rhythm and intact distal pulses.  Exam reveals no gallop and no friction rub.   No murmur heard. Pulmonary/Chest: No stridor. No respiratory distress. He has no wheezes. He has no rales.  Abdominal: Soft. Bowel sounds are normal. He exhibits no distension and no mass. There is no tenderness.  Musculoskeletal: Normal range of motion. He exhibits no edema or tenderness.  Lymphadenopathy:    He has no cervical adenopathy.  Neurological: He is alert and oriented to person, place, and time.  Skin: Skin is warm and dry. No rash noted. No erythema. No pallor.  Psychiatric: He has a normal mood and affect.   Assessment and Plan :   1. Annual physical exam - Labs pending - Patient is medically healthy - Discussed healthy lifestyle, diet, exercise, preventative care, vaccinations, and addressed patient's concerns.   Marquita Palms  Leandrew KoyanagiMani, PA-C [facility] [phone] 04/11/2015  8:06  AM

## 2015-04-18 ENCOUNTER — Telehealth: Payer: Self-pay

## 2015-04-18 NOTE — Telephone Encounter (Signed)
The patient called to ask a few clinical questions regarding his last office visit with Gurney MaxinMike Mani.  He also wants to discuss his labwork.  He was also interested in receiving a referral for his headaches, but wasn't sure what kind of specialist would be best.  Please advise, thank you.  I confirmed the patient's preferred phone number, which is listed below.  CB#: [phone]

## 2015-04-19 NOTE — Telephone Encounter (Signed)
Having headaches for over a month. Intermittent, dull headaches, mild in nature. No photosensitivity, tinnitus, numbness or tingling, eye pain, ear pain, congestion. Patient has not tried any medicines. States that he will try NSAIDs, hydrating, sleeping well and eating regular balanced meals. If no improvement in 1 week, he will call. We can proceed with referral to Neuro at that point.

## 2015-04-19 NOTE — Telephone Encounter (Signed)
Pt had a question regarding labs for Hep C and HIV, he stated that Kathlene NovemberMike had mention this to him at his appointment. Pt advised that labs were not drawn on these test. He stated he does not feel he wants this done at this time. He does want a referral for headaches. Pt advised message will be given to Kathlene NovemberMike and he will be called when referral is made.

## 2015-07-12 ENCOUNTER — Ambulatory Visit (INDEPENDENT_AMBULATORY_CARE_PROVIDER_SITE_OTHER): Payer: BLUE CROSS/BLUE SHIELD | Admitting: Family Medicine

## 2015-07-12 ENCOUNTER — Encounter: Payer: Self-pay | Admitting: Family Medicine

## 2015-07-12 VITALS — BP 123/83 | HR 57 | Temp 98.0°F | Resp 16 | Ht 66.0 in | Wt 174.0 lb

## 2015-07-12 DIAGNOSIS — J3089 Other allergic rhinitis: Secondary | ICD-10-CM

## 2015-07-12 DIAGNOSIS — Z7189 Other specified counseling: Secondary | ICD-10-CM

## 2015-07-12 DIAGNOSIS — K219 Gastro-esophageal reflux disease without esophagitis: Secondary | ICD-10-CM | POA: Diagnosis not present

## 2015-07-12 DIAGNOSIS — Z7689 Persons encountering health services in other specified circumstances: Secondary | ICD-10-CM

## 2015-07-12 MED ORDER — FLUTICASONE PROPIONATE 50 MCG/ACT NA SUSP
2.0000 | Freq: Every day | NASAL | Status: DC
Start: 2015-07-12 — End: 2021-06-20

## 2015-07-12 MED ORDER — RANITIDINE HCL 150 MG PO CAPS: 150.0000 mg | ORAL_CAPSULE | Freq: Every evening | ORAL | Status: DC

## 2015-07-12 MED ORDER — LORATADINE 10 MG PO TABS: 10.0000 mg | ORAL_TABLET | Freq: Every day | ORAL | Status: DC

## 2015-07-12 NOTE — Patient Instructions (Signed)
I think your throat pain and headaches could be related to sinus drainage. We will try flonase and an over the counter allergy pill to see if this helps your symptoms.  Your symptoms could also be related to acid reflux- take Ranitdine once daily to help with this.   If you have trouble swallowing, breathing, or feel your throat is closing- please seek immediate medical attention.

## 2015-07-12 NOTE — Progress Notes (Signed)
Subjective:    Patient ID: Parker Rogers, male    DOB: [birth year], 35 y.o.   MRN: [id number]  HPI: Parker Rogers is a 35 y.o. male presenting on 07/12/2015 for Establish Care   HPI  Pt presents to establish care today. Previous care provider was [facility].  It has been 3 months months since His last PCP visit. Records from previous provider will be requested and reviewed. Current medical problems include:  High cholesterol: Found on physical.  Has made lifestyle changes. Takes fish oil.  R sided throat pain: Uncomfortable when he swallows. Present for a few weeks. No recent URI. No issues with eating. Has not tried losenges. No sensation of lump in the throat. No dysphagia, no acid reflux. Previous issues with heart burn in the past.  Headaches: Was seen at urgent care- told he was dehydrated. He gets headaches after salty  greasy food. Thinks it might be related to MSG. Headaches- have no particular spot. Would not go away with tylenol. Goes away with drinking water or spicy food.   Health maintenance:  Last TDAP- 2015 Flu up to date.    Past Medical History  Diagnosis Date  . Fracture of forearm, closed 1999    left arm  . Hyperlipidemia    Social History   Social History  . Marital Status: Married    Spouse Name: N/A  . Number of Children: N/A  . Years of Education: N/A   Occupational History  . Forensic scientist    Social History Main Topics  . Smoking status: Never Smoker   . Smokeless tobacco: Not on file  . Alcohol Use: 0.5 oz/week    1 Standard drinks or equivalent per week     Comment: socially  . Drug Use: No  . Sexual Activity: Yes   Other Topics Concern  . Not on file   Social History Narrative   Married 02/2014, just came back from his honeymoon. No children yet. 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 Eats healthy and exercises regularly.   Family History  Problem Relation Age of Onset  .  Hyperlipidemia Father    Current Outpatient Prescriptions on File Prior to Visit  Medication Sig  . Glucosamine HCl (GLUCOSAMINE PO) Take by mouth daily.  . Multiple Vitamin (MULTIVITAMIN) tablet Take 1 tablet by mouth daily.  . Omega-3 Fatty Acids (FISH OIL) 1000 MG CAPS Take by mouth daily.   No current facility-administered medications on file prior to visit.    Review of Systems  Constitutional: Negative for fever and chills.  HENT: Positive for sore throat.   Respiratory: Negative for chest tightness, shortness of breath and wheezing.   Cardiovascular: Negative for chest pain, palpitations and leg swelling.  Gastrointestinal: Negative for nausea, vomiting and abdominal pain.  Endocrine: Negative.   Genitourinary: Negative for dysuria, urgency, discharge, penile pain and testicular pain.  Musculoskeletal: Negative for back pain, joint swelling and arthralgias.  Skin: Negative.   Neurological: Positive for headaches. Negative for dizziness, weakness and numbness.  Psychiatric/Behavioral: Negative for sleep disturbance and dysphoric mood.   Per HPI unless specifically indicated above     Objective:    BP 123/83 mmHg  Pulse 57  Temp(Src) 98 F (36.7 C) (Oral)  Resp 16  Ht  (1.676 m)  Wt 174 lb (78.926 kg)  BMI 28.10 kg/m2  Wt Readings from Last 3 Encounters:  07/12/15 174 lb (78.926 kg)  04/11/15 166 lb 6.4  oz (75.479 kg)  10/14/14 170 lb (77.111 kg)    Physical Exam  Constitutional: He is oriented to person, place, and time. He appears well-developed and well-nourished. No distress.  HENT:  Head: Normocephalic and atraumatic.  Right Ear: Hearing and tympanic membrane normal.  Left Ear: Hearing and tympanic membrane normal.  Nose: No mucosal edema or rhinorrhea. Right sinus exhibits no maxillary sinus tenderness and no frontal sinus tenderness. Left sinus exhibits no maxillary sinus tenderness and no frontal sinus tenderness.  Mouth/Throat: Uvula is midline and  mucous membranes are normal. Posterior oropharyngeal erythema (mild throat irriation. ) present.  Neck: Neck supple. No thyromegaly present.  Cardiovascular: Normal rate, regular rhythm and normal heart sounds.  Exam reveals no gallop and no friction rub.   No murmur heard. Pulmonary/Chest: Effort normal and breath sounds normal. He has no wheezes.  Abdominal: Soft. Bowel sounds are normal. He exhibits no distension. There is no tenderness. There is no rebound.  Musculoskeletal: Normal range of motion. He exhibits no edema or tenderness.  Neurological: He is alert and oriented to person, place, and time. He has normal reflexes.  Skin: Skin is warm and dry. No rash noted. No erythema.  Psychiatric: He has a normal mood and affect. His behavior is normal. Thought content normal.   Results for orders placed or performed in visit on 04/11/15  Comprehensive metabolic panel  Result Value Ref Range   Sodium 138 135 - 146 mmol/L   Potassium 4.9 3.5 - 5.3 mmol/L   Chloride 100 98 - 110 mmol/L   CO2 30 20 - 31 mmol/L   Glucose, Bld 85 65 - 99 mg/dL   BUN 18 7 - 25 mg/dL   Creat 1.61 0.96 - 0.45 mg/dL   Total Bilirubin 0.8 0.2 - 1.2 mg/dL   Alkaline Phosphatase 54 40 - 115 U/L   AST 25 10 - 40 U/L   ALT 34 9 - 46 U/L   Total Protein 7.4 6.1 - 8.1 g/dL   Albumin 4.7 3.6 - 5.1 g/dL   Calcium 9.8 8.6 - 40.9 mg/dL  Lipid panel  Result Value Ref Range   Cholesterol 213 (H) 125 - 200 mg/dL   Triglycerides 811 (H) <150 mg/dL   HDL 48 >=91 mg/dL   Total CHOL/HDL Ratio 4.4 <=5.0 Ratio   VLDL 34 (H) <30 mg/dL   LDL Cholesterol 478 (H) <130 mg/dL  TSH  Result Value Ref Range   TSH 1.228 0.350 - 4.500 uIU/mL  CBC  Result Value Ref Range   WBC 5.2 4.0 - 10.5 K/uL   RBC 5.86 (H) 4.22 - 5.81 MIL/uL   Hemoglobin 16.6 13.0 - 17.0 g/dL   HCT 29.5 62.1 - 30.8 %   MCV 81.6 78.0 - 100.0 fL   MCH 28.3 26.0 - 34.0 pg   MCHC 34.7 30.0 - 36.0 g/dL   RDW 65.7 84.6 - 96.2 %   Platelets 254 150 - 400 K/uL     MPV 10.3 8.6 - 12.4 fL      Assessment & Plan:   Problem List Items Addressed This Visit      Respiratory   Other allergic rhinitis - Primary    Throat pain might be drainage related. Trial of flonase and claritin to help with symptoms.  RTC 1 mos.  Consider ENT referral if symptoms are not improving.       Relevant Medications   fluticasone (FLONASE) 50 MCG/ACT nasal spray   loratadine (CLARITIN) 10 MG tablet  Other Visit Diagnoses    Gastroesophageal reflux disease without esophagitis        Throat pain may be silent GERD. Trial of PRN zantac to see if symptoms improve. RTC 1 mos.     Relevant Medications    ranitidine (ZANTAC) 150 MG capsule       Meds ordered this encounter  Medications  . fluticasone (FLONASE) 50 MCG/ACT nasal spray    Sig: Place 2 sprays into both nostrils daily.    Dispense:  16 g    Refill:  11    Order Specific Question:  Supervising Provider    Answer:  Janeann Forehand [phone]  . loratadine (CLARITIN) 10 MG tablet    Sig: Take 1 tablet (10 mg total) by mouth daily.    Dispense:  30 tablet    Refill:  11    Order Specific Question:  Supervising Provider    Answer:  Janeann Forehand [phone]  . ranitidine (ZANTAC) 150 MG capsule    Sig: Take 1 capsule (150 mg total) by mouth every evening.    Dispense:  30 capsule    Refill:  11    Order Specific Question:  Supervising Provider    Answer:  Janeann Forehand [phone]      Follow up plan: Return in about 4 weeks (around 08/09/2015) for throat pain. Marland Kitchen

## 2015-07-12 NOTE — Assessment & Plan Note (Signed)
Throat pain might be drainage related. Trial of flonase and claritin to help with symptoms.  RTC 1 mos.  Consider ENT referral if symptoms are not improving.

## 2017-03-18 ENCOUNTER — Other Ambulatory Visit: Payer: Self-pay | Admitting: Occupational Medicine

## 2017-03-18 ENCOUNTER — Ambulatory Visit: Payer: Self-pay

## 2017-03-18 DIAGNOSIS — Z0000 Encounter for general adult medical examination without abnormal findings: Secondary | ICD-10-CM

## 2018-07-09 DIAGNOSIS — J00 Acute nasopharyngitis [common cold]: Secondary | ICD-10-CM | POA: Diagnosis not present

## 2018-08-09 IMAGING — DX DG CHEST 1V
1 series · 1 of 1 positions shown · non-contrast
Comparison: None in PACs.

CLINICAL DATA: Physical examination.  Nonsmoker.

EXAM:
CHEST 1 VIEW

[chest pa]
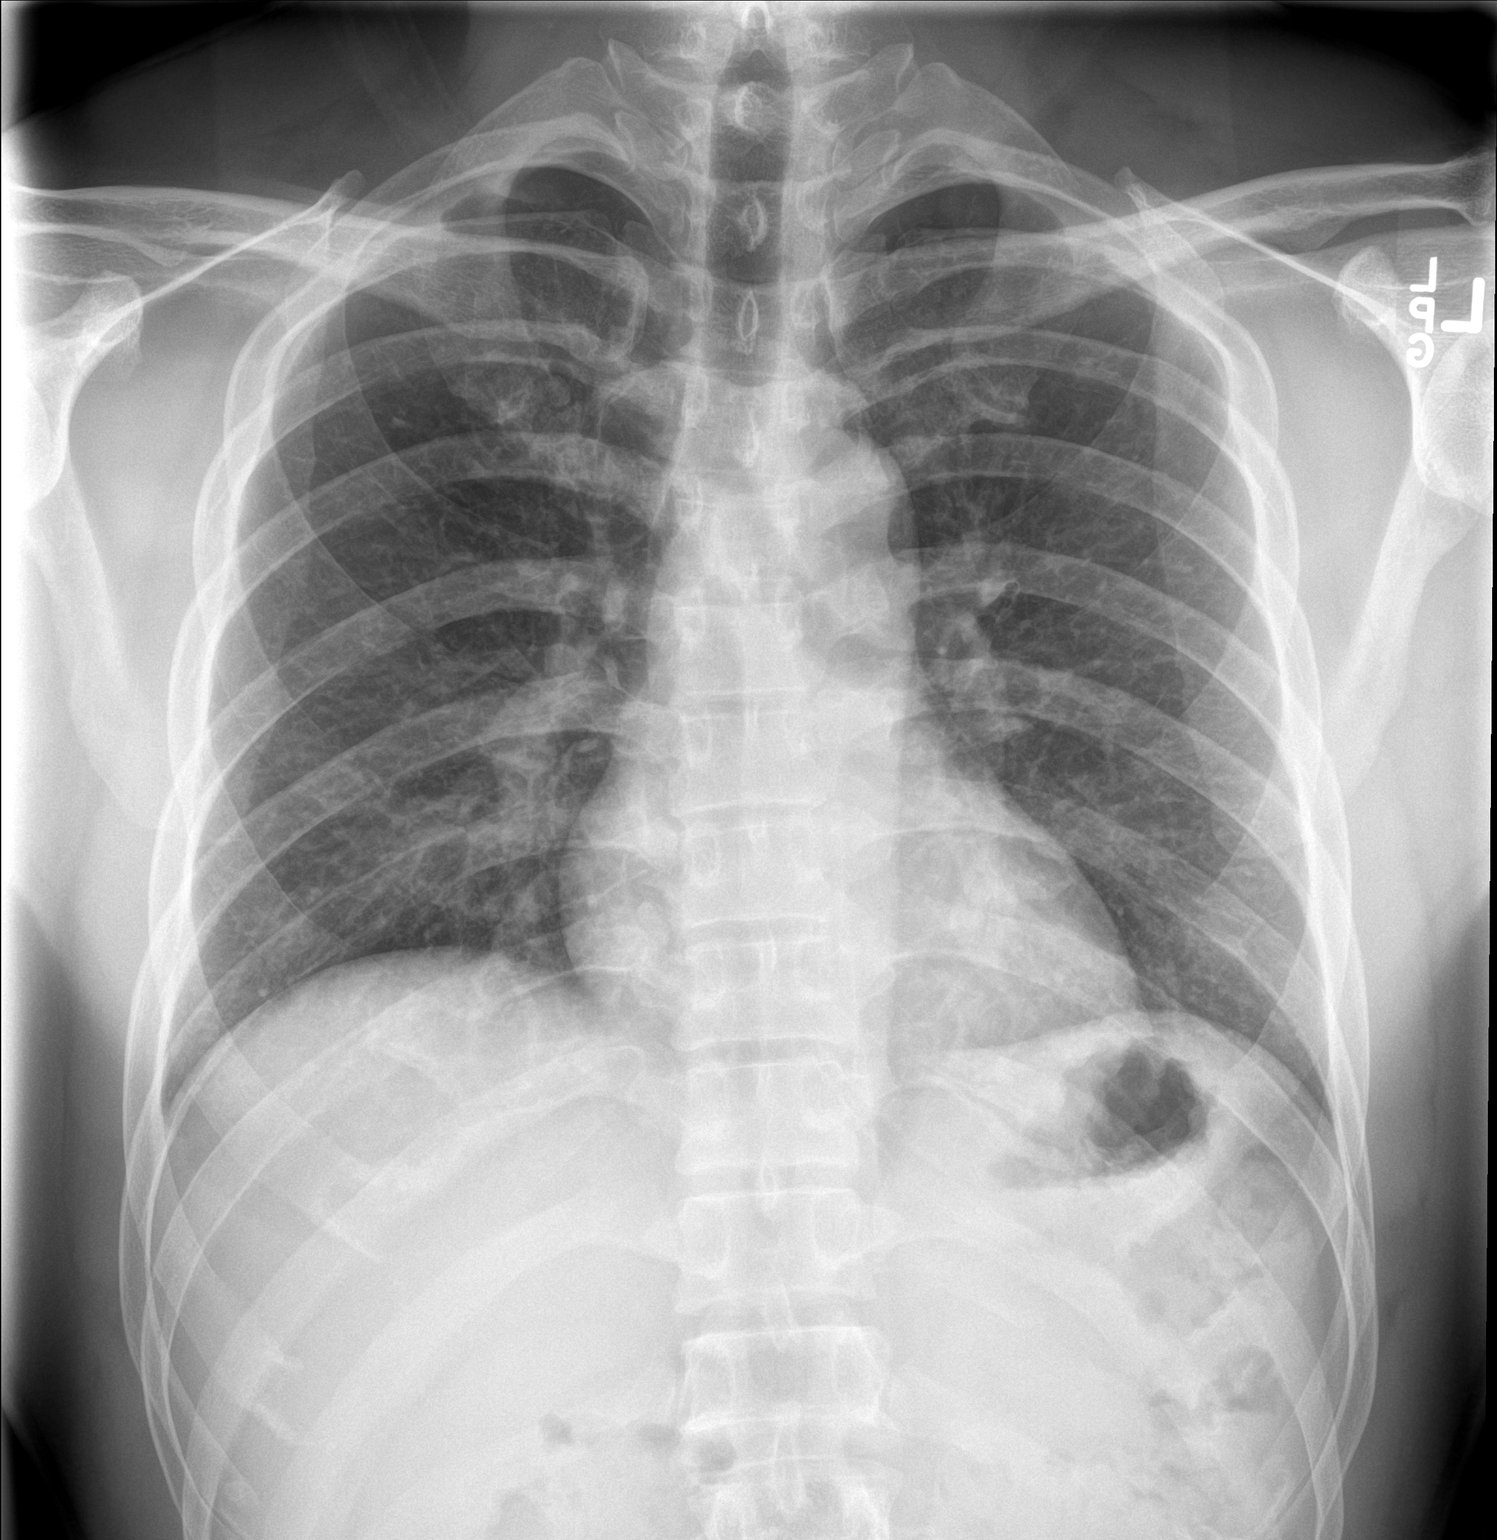

[1 of 1 positions shown; findings below may reference images not displayed]

FINDINGS: The lungs are adequately inflated and clear. The heart and pulmonary
vascularity are normal. The mediastinum is normal in width. There is
no pleural effusion. The bony thorax exhibits no acute abnormality.
IMPRESSION: There is no active cardiopulmonary disease.

## 2018-08-25 DIAGNOSIS — L290 Pruritus ani: Secondary | ICD-10-CM | POA: Diagnosis not present

## 2018-12-23 DIAGNOSIS — S51011A Laceration without foreign body of right elbow, initial encounter: Secondary | ICD-10-CM | POA: Diagnosis not present

## 2019-01-07 DIAGNOSIS — S51011D Laceration without foreign body of right elbow, subsequent encounter: Secondary | ICD-10-CM | POA: Diagnosis not present

## 2020-01-26 ENCOUNTER — Other Ambulatory Visit: Payer: Self-pay

## 2020-01-27 ENCOUNTER — Ambulatory Visit (INDEPENDENT_AMBULATORY_CARE_PROVIDER_SITE_OTHER): Payer: BC Managed Care – PPO | Admitting: Family Medicine

## 2020-01-27 ENCOUNTER — Encounter: Payer: Self-pay | Admitting: Family Medicine

## 2020-01-27 VITALS — BP 102/70 | HR 70 | Temp 97.0°F | Ht 65.0 in | Wt 163.0 lb

## 2020-01-27 DIAGNOSIS — Z0000 Encounter for general adult medical examination without abnormal findings: Secondary | ICD-10-CM

## 2020-01-27 DIAGNOSIS — E7800 Pure hypercholesterolemia, unspecified: Secondary | ICD-10-CM | POA: Diagnosis not present

## 2020-01-27 DIAGNOSIS — Z01118 Encounter for examination of ears and hearing with other abnormal findings: Secondary | ICD-10-CM | POA: Diagnosis not present

## 2020-01-27 DIAGNOSIS — Z1389 Encounter for screening for other disorder: Secondary | ICD-10-CM | POA: Insufficient documentation

## 2020-01-27 NOTE — Patient Instructions (Signed)
Health Maintenance, Male Adopting a healthy lifestyle and getting preventive care are important in promoting health and wellness. Ask your health care provider about:  The right schedule for you to have regular tests and exams.  Things you can do on your own to prevent diseases and keep yourself healthy. What should I know about diet, weight, and exercise? Eat a healthy diet   Eat a diet that includes plenty of vegetables, fruits, low-fat dairy products, and lean protein.  Do not eat a lot of foods that are high in solid fats, added sugars, or sodium. Maintain a healthy weight Body mass index (BMI) is a measurement that can be used to identify possible weight problems. It estimates body fat based on height and weight. Your health care provider can help determine your BMI and help you achieve or maintain a healthy weight. Get regular exercise Get regular exercise. This is one of the most important things you can do for your health. Most adults should:  Exercise for at least 150 minutes each week. The exercise should increase your heart rate and make you sweat (moderate-intensity exercise).  Do strengthening exercises at least twice a week. This is in addition to the moderate-intensity exercise.  Spend less time sitting. Even light physical activity can be beneficial. Watch cholesterol and blood lipids Have your blood tested for lipids and cholesterol at 40 years of age, then have this test every 5 years. You may need to have your cholesterol levels checked more often if:  Your lipid or cholesterol levels are high.  You are older than 40 years of age.  You are at high risk for heart disease. What should I know about cancer screening? Many types of cancers can be detected early and may often be prevented. Depending on your health history and family history, you may need to have cancer screening at various ages. This may include screening for:  Colorectal cancer.  Prostate  cancer.  Skin cancer.  Lung cancer. What should I know about heart disease, diabetes, and high blood pressure? Blood pressure and heart disease  High blood pressure causes heart disease and increases the risk of stroke. This is more likely to develop in people who have high blood pressure readings, are of African descent, or are overweight.  Talk with your health care provider about your target blood pressure readings.  Have your blood pressure checked: ? Every 3-5 years if you are 18-40 years of age. ? Every year if you are 40 years old or older.  If you are between the ages of 65 and 75 and are a current or former smoker, ask your health care provider if you should have a one-time screening for abdominal aortic aneurysm (AAA). Diabetes Have regular diabetes screenings. This checks your fasting blood sugar level. Have the screening done:  Once every three years after age 40 if you are at a normal weight and have a low risk for diabetes.  More often and at a younger age if you are overweight or have a high risk for diabetes. What should I know about preventing infection? Hepatitis B If you have a higher risk for hepatitis B, you should be screened for this virus. Talk with your health care provider to find out if you are at risk for hepatitis B infection. Hepatitis C Blood testing is recommended for:  Everyone born from 1945 through 1965.  Anyone with known risk factors for hepatitis C. Sexually transmitted infections (STIs)  You should be screened each year   for STIs, including gonorrhea and chlamydia, if: ? You are sexually active and are younger than 40 years of age. ? You are older than 40 years of age and your health care provider tells you that you are at risk for this type of infection. ? Your sexual activity has changed since you were last screened, and you are at increased risk for chlamydia or gonorrhea. Ask your health care provider if you are at risk.  Ask your  health care provider about whether you are at high risk for HIV. Your health care provider may recommend a prescription medicine to help prevent HIV infection. If you choose to take medicine to prevent HIV, you should first get tested for HIV. You should then be tested every 3 months for as long as you are taking the medicine. Follow these instructions at home: Lifestyle  Do not use any products that contain nicotine or tobacco, such as cigarettes, e-cigarettes, and chewing tobacco. If you need help quitting, ask your health care provider.  Do not use street drugs.  Do not share needles.  Ask your health care provider for help if you need support or information about quitting drugs. Alcohol use  Do not drink alcohol if your health care provider tells you not to drink.  If you drink alcohol: ? Limit how much you have to 0-2 drinks a day. ? Be aware of how much alcohol is in your drink. In the [place] one drink equals one 12 oz bottle of beer (355 mL), one 5 oz glass of wine (148 mL), or one 1 oz glass of hard liquor (44 mL). General instructions  Schedule regular health, dental, and eye exams.  Stay current with your vaccines.  Tell your health care provider if: ? You often feel depressed. ? You have ever been abused or do not feel safe at home. Summary  Adopting a healthy lifestyle and getting preventive care are important in promoting health and wellness.  Follow your health care provider's instructions about healthy diet, exercising, and getting tested or screened for diseases.  Follow your health care provider's instructions on monitoring your cholesterol and blood pressure. This information is not intended to replace advice given to you by your health care provider. Make sure you discuss any questions you have with your health care provider. Document Revised: 06/02/2018 Document Reviewed: 06/02/2018 Elsevier Patient Education  2020 [employer] Inc.  Preventive Care 21-39 Years  Old, Male Preventive care refers to lifestyle choices and visits with your health care provider that can promote health and wellness. This includes:  A yearly physical exam. This is also called an annual well check.  Regular dental and eye exams.  Immunizations.  Screening for certain conditions.  Healthy lifestyle choices, such as eating a healthy diet, getting regular exercise, not using drugs or products that contain nicotine and tobacco, and limiting alcohol use. What can I expect for my preventive care visit? Physical exam Your health care provider will check:  Height and weight. These may be used to calculate body mass index (BMI), which is a measurement that tells if you are at a healthy weight.  Heart rate and blood pressure.  Your skin for abnormal spots. Counseling Your health care provider may ask you questions about:  Alcohol, tobacco, and drug use.  Emotional well-being.  Home and relationship well-being.  Sexual activity.  Eating habits.  Work and work environment. What immunizations do I need?  Influenza (flu) vaccine  This is recommended every year. Tetanus, diphtheria,   and pertussis (Tdap) vaccine  You may need a Td booster every 10 years. Varicella (chickenpox) vaccine  You may need this vaccine if you have not already been vaccinated. Human papillomavirus (HPV) vaccine  If recommended by your health care provider, you may need three doses over 6 months. Measles, mumps, and rubella (MMR) vaccine  You may need at least one dose of MMR. You may also need a second dose. Meningococcal conjugate (MenACWY) vaccine  One dose is recommended if you are 73-28 years old and a Market researcher living in a residence hall, or if you have one of several medical conditions. You may also need additional booster doses. Pneumococcal conjugate (PCV13) vaccine  You may need this if you have certain conditions and were not previously  vaccinated. Pneumococcal polysaccharide (PPSV23) vaccine  You may need one or two doses if you smoke cigarettes or if you have certain conditions. Hepatitis A vaccine  You may need this if you have certain conditions or if you travel or work in places where you may be exposed to hepatitis A. Hepatitis B vaccine  You may need this if you have certain conditions or if you travel or work in places where you may be exposed to hepatitis B. Haemophilus influenzae type b (Hib) vaccine  You may need this if you have certain risk factors. You may receive vaccines as individual doses or as more than one vaccine together in one shot (combination vaccines). Talk with your health care provider about the risks and benefits of combination vaccines. What tests do I need? Blood tests  Lipid and cholesterol levels. These may be checked every 5 years starting at age 80.  Hepatitis C test.  Hepatitis B test. Screening   Diabetes screening. This is done by checking your blood sugar (glucose) after you have not eaten for a while (fasting).  Sexually transmitted disease (STD) testing. Talk with your health care provider about your test results, treatment options, and if necessary, the need for more tests. Follow these instructions at home: Eating and drinking   Eat a diet that includes fresh fruits and vegetables, whole grains, lean protein, and low-fat dairy products.  Take vitamin and mineral supplements as recommended by your health care provider.  Do not drink alcohol if your health care provider tells you not to drink.  If you drink alcohol: ? Limit how much you have to 0-2 drinks a day. ? Be aware of how much alcohol is in your drink. In the [place] one drink equals one 12 oz bottle of beer (355 mL), one 5 oz glass of wine (148 mL), or one 1 oz glass of hard liquor (44 mL). Lifestyle  Take daily care of your teeth and gums.  Stay active. Exercise for at least 30 minutes on 5 or more days  each week.  Do not use any products that contain nicotine or tobacco, such as cigarettes, e-cigarettes, and chewing tobacco. If you need help quitting, ask your health care provider.  If you are sexually active, practice safe sex. Use a condom or other form of protection to prevent STIs (sexually transmitted infections). What's next?  Go to your health care provider once a year for a well check visit.  Ask your health care provider how often you should have your eyes and teeth checked.  Stay up to date on all vaccines. This information is not intended to replace advice given to you by your health care provider. Make sure you discuss any questions you  have with your health care provider. Document Revised: 06/03/2018 Document Reviewed: 06/03/2018 Elsevier Patient Education  2020 Else[employer] Inc.  Preventing High Cholesterol Cholesterol is a white, waxy substance similar to fat that the human body needs to help build cells. The liver makes all the cholesterol that a person's body needs. Having high cholesterol (hypercholesterolemia) increases a person's risk for heart disease and stroke. Extra (excess) cholesterol comes from the food the person eats. High cholesterol can often be prevented with diet and lifestyle changes. If you already have high cholesterol, you can control it with diet and lifestyle changes and with medicine. How can high cholesterol affect me? If you have high cholesterol, deposits (plaques) may build up on the walls of your arteries. The arteries are the blood vessels that carry blood away from your heart. Plaques make the arteries narrower and stiffer. This can limit or block blood flow and cause blood clots to form. Blood clots:  Are tiny balls of cells that form in your blood.  Can move to the heart or brain, causing a heart attack or stroke. Plaques in arteries greatly increase your risk for heart attack and stroke.Making diet and lifestyle changes can reduce your risk  for these conditions that may threaten your life. What can increase my risk? This condition is more likely to develop in people who:  Eat foods that are high in saturated fat or cholesterol. Saturated fat is mostly found in: ? Foods that contain animal fat, such as red meat and some dairy products. ? Certain fatty foods made from plants, such as tropical oils.  Are overweight.  Are not getting enough exercise.  Have a family history of high cholesterol. What actions can I take to prevent this? Nutrition   Eat less saturated fat.  Avoid trans fats (partially hydrogenated oils). These are often found in margarine and in some baked goods, fried foods, and snacks bought in packages.  Avoid precooked or cured meat, such as sausages or meat loaves.  Avoid foods and drinks that have added sugars.  Eat more fruits, vegetables, and whole grains.  Choose healthy sources of protein, such as fish, poultry, lean cuts of red meat, beans, peas, lentils, and nuts.  Choose healthy sources of fat, such as: ? Nuts. ? Vegetable oils, especially olive oil. ? Fish that have healthy fats (omega-3 fatty acids), such as mackerel or salmon. The items listed above may not be a complete list of recommended foods and beverages. Contact a dietitian for more information. Lifestyle  Lose weight if you are overweight. Losing 5-10 lb (2.3-4.5 kg) can help prevent or control high cholesterol. It can also lower your risk for diabetes and high blood pressure. Ask your health care provider to help you with a diet and exercise plan to lose weight safely.  Do not use any products that contain nicotine or tobacco, such as cigarettes, e-cigarettes, and chewing tobacco. If you need help quitting, ask your health care provider.  Limit your alcohol intake. ? Do not drink alcohol if:  Your health care provider tells you not to drink.  You are pregnant, may be pregnant, or are planning to become pregnant. ? If you  drink alcohol:  Limit how much you use to:  0-1 drink a day for women.  0-2 drinks a day for men.  Be aware of how much alcohol is in your drink. In the [place] one drink equals one 12 oz bottle of beer (355 mL), one 5 oz glass of wine (148   mL), or one 1 oz glass of hard liquor (44 mL). Activity   Get enough exercise. Each week, do at least 150 minutes of exercise that takes a medium level of effort (moderate-intensity exercise). ? This is exercise that:  Makes your heart beat faster and makes you breathe harder than usual.  Allows you to still be able to talk. ? You could exercise in short sessions several times a day or longer sessions a few times a week. For example, on 5 days each week, you could walk fast or ride your bike 3 times a day for 10 minutes each time.  Do exercises as told by your health care provider. Medicines  In addition to diet and lifestyle changes, your health care provider may recommend medicines to help lower cholesterol. This may be a medicine to lower the amount of cholesterol your liver makes. You may need medicine if: ? Diet and lifestyle changes do not lower your cholesterol enough. ? You have high cholesterol and other risk factors for heart disease or stroke.  Take over-the-counter and prescription medicines only as told by your health care provider. General information  Manage your risk factors for high cholesterol. Talk with your health care provider about all your risk factors and how to lower your risk.  Manage other conditions that you have, such as diabetes or high blood pressure (hypertension).  Have blood tests to check your cholesterol levels at regular points in time as told by your health care provider.  Keep all follow-up visits as told by your health care provider. This is important. Where to find more information  American Heart Association: www.heart.org  National Heart, Lung, and Blood Institute:  https://wilson-eaton.com/ Summary  High cholesterol increases your risk for heart disease and stroke. By keeping your cholesterol level low, you can reduce your risk for these conditions.  High cholesterol can often be prevented with diet and lifestyle changes.  Work with your health care provider to manage your risk factors, and have your blood tested regularly. This information is not intended to replace advice given to you by your health care provider. Make sure you discuss any questions you have with your health care provider. Document Revised: 10/01/2018 Document Reviewed: 02/16/2016 Elsevier Patient Education  2020 [employer].

## 2020-01-27 NOTE — Progress Notes (Signed)
Established Patient Office Visit  Subjective:  Patient ID: Parker Rogers, male    DOB: Aug 11, 1980  Age: 39 y.o. MRN: [id number]  CC:  Chief Complaint  Patient presents with  . Annual Exam    CPE, no concerns.     HPI Parker Rogers presents for routine physical.  He is not fasting today.  He lives at home with his wife and their new baby who is 55 weeks old.  Baby is doing well.  Chart review shows history of elevated cholesterol.  He has no problems with hearing.  Feels pressure in his forehead sometimes when he adds MSG to his food.  Has no history of migraines.  Past Medical History:  Diagnosis Date  . Fracture of forearm, closed 1999   left arm  . Hyperlipidemia     Past Surgical History:  Procedure Laterality Date  . CLOSED REDUCTION FOREARM FRACTURE  1999   left forearm  . FRACTURE SURGERY  1999   Left arm  . WISDOM TOOTH EXTRACTION  2016    Family History  Problem Relation Age of Onset  . Hyperlipidemia Father     Social History   Socioeconomic History  . Marital status: Married    Spouse name: Not on file  . Number of children: Not on file  . Years of education: Not on file  . Highest education level: Not on file  Occupational History  . Occupation: Forensic scientist  Tobacco Use  . Smoking status: Never Smoker  . Smokeless tobacco: Never Used  Substance and Sexual Activity  . Alcohol use: Yes    Alcohol/week: 1.0 standard drink    Types: 1 Standard drinks or equivalent per week    Comment: socially  . Drug use: No  . Sexual activity: Yes  Other Topics Concern  . Not on file  Social History Narrative   Married 02/2014, just came back from his honeymoon. No children yet. 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 Eats healthy and exercises regularly.   Social Determinants of Health   Financial Resource Strain:   . Difficulty of Paying Living Expenses:   Food Insecurity:   . Worried About Programme researcher, broadcasting/film/video  in the Last Year:   . Barista in the Last Year:   Transportation Needs:   . Freight forwarder (Medical):   Marland Kitchen Lack of Transportation (Non-Medical):   Physical Activity:   . Days of Exercise per Week:   . Minutes of Exercise per Session:   Stress:   . Feeling of Stress :   Social Connections:   . Frequency of Communication with Friends and Family:   . Frequency of Social Gatherings with Friends and Family:   . Attends Religious Services:   . Active Member of Clubs or Organizations:   . Attends Banker Meetings:   Marland Kitchen Marital Status:   Intimate Partner Violence:   . Fear of Current or Ex-Partner:   . Emotionally Abused:   Marland Kitchen Physically Abused:   . Sexually Abused:     Outpatient Medications Prior to Visit  Medication Sig Dispense Refill  . Glucosamine HCl (GLUCOSAMINE PO) Take by mouth daily.    . Multiple Vitamin (MULTIVITAMIN) tablet Take 1 tablet by mouth daily.    . Omega-3 Fatty Acids (FISH OIL) 1000 MG CAPS Take by mouth daily.    . fluticasone (FLONASE) 50 MCG/ACT nasal spray Place 2 sprays into both nostrils daily. (Patient not  taking: Reported on 01/27/2020) 16 g 11  . loratadine (CLARITIN) 10 MG tablet Take 1 tablet (10 mg total) by mouth daily. (Patient not taking: Reported on 01/27/2020) 30 tablet 11  . ranitidine (ZANTAC) 150 MG capsule Take 1 capsule (150 mg total) by mouth every evening. (Patient not taking: Reported on 01/27/2020) 30 capsule 11   No facility-administered medications prior to visit.    No Known Allergies  ROS Review of Systems  Constitutional: Negative.   HENT: Negative.  Negative for ear discharge, ear pain and hearing loss.   Eyes: Negative for photophobia and visual disturbance.  Respiratory: Negative.   Cardiovascular: Negative.   Gastrointestinal: Negative.   Endocrine: Negative for polyphagia and polyuria.  Genitourinary: Negative.   Musculoskeletal: Negative.  Negative for gait problem and joint swelling.  Skin:  Negative for pallor and rash.  Allergic/Immunologic: Negative for immunocompromised state.  Neurological: Negative for light-headedness and headaches.  Hematological: Does not bruise/bleed easily.  Psychiatric/Behavioral: Negative.       Objective:    Physical Exam Vitals and nursing note reviewed.  Constitutional:      General: He is not in acute distress.    Appearance: Normal appearance. He is normal weight. He is not ill-appearing, toxic-appearing or diaphoretic.  HENT:     Head: Normocephalic and atraumatic.     Right Ear: Tympanic membrane and external ear normal.     Left Ear: Tympanic membrane and external ear normal.     Ears:      Mouth/Throat:     Mouth: Mucous membranes are moist.     Pharynx: Oropharynx is clear. No oropharyngeal exudate or posterior oropharyngeal erythema.  Eyes:     General: No scleral icterus.       Right eye: No discharge.        Left eye: No discharge.     Extraocular Movements: Extraocular movements intact.  Cardiovascular:     Rate and Rhythm: Normal rate and regular rhythm.     Pulses: Normal pulses.     Heart sounds: Normal heart sounds.  Pulmonary:     Effort: Pulmonary effort is normal.     Breath sounds: Normal breath sounds.  Abdominal:     General: Abdomen is flat. Bowel sounds are normal. There is no distension.     Palpations: Abdomen is soft. There is no mass.     Tenderness: There is no abdominal tenderness. There is no guarding or rebound.     Hernia: No hernia is present. There is no hernia in the left inguinal area or right inguinal area.  Genitourinary:    Penis: Uncircumcised. No phimosis, paraphimosis, hypospadias, erythema, tenderness or discharge.      Testes:        Right: Mass, tenderness or swelling not present. Right testis is descended.        Left: Tenderness or swelling not present. Left testis is descended.     Epididymis:     Right: Not inflamed or enlarged.     Left: Not inflamed or enlarged.    Musculoskeletal:     Cervical back: No rigidity or tenderness.  Lymphadenopathy:     Cervical: No cervical adenopathy.     Lower Body: No right inguinal adenopathy. No left inguinal adenopathy.  Skin:    General: Skin is warm and dry.  Neurological:     Mental Status: He is alert and oriented to person, place, and time.  Psychiatric:        Mood and Affect:  Mood normal.        Behavior: Behavior normal.     BP 102/70   Pulse 70   Temp (!) 97 F (36.1 C) (Tympanic)   Ht 5\' 5"  (1.651 m)   Wt 163 lb (73.9 kg)   SpO2 96%   BMI 27.12 kg/m  Wt Readings from Last 3 Encounters:  01/27/20 163 lb (73.9 kg)  07/12/15 174 lb (78.9 kg)  04/11/15 166 lb 6.4 oz (75.5 kg)     Health Maintenance Due  Topic Date Due  . Hepatitis C Screening  Never done  . HIV Screening  Never done  . INFLUENZA VACCINE  01/22/2020    There are no preventive care reminders to display for this patient.  Lab Results  Component Value Date   TSH 1.228 04/11/2015   Lab Results  Component Value Date   WBC 5.2 04/11/2015   HGB 16.6 04/11/2015   HCT 47.8 04/11/2015   MCV 81.6 04/11/2015   PLT 254 04/11/2015   Lab Results  Component Value Date   NA 138 04/11/2015   K 4.9 04/11/2015   CO2 30 04/11/2015   GLUCOSE 85 04/11/2015   BUN 18 04/11/2015   CREATININE 1.21 04/11/2015   BILITOT 0.8 04/11/2015   ALKPHOS 54 04/11/2015   AST 25 04/11/2015   ALT 34 04/11/2015   PROT 7.4 04/11/2015   ALBUMIN 4.7 04/11/2015   CALCIUM 9.8 04/11/2015   Lab Results  Component Value Date   CHOL 213 (H) 04/11/2015   Lab Results  Component Value Date   HDL 48 04/11/2015   Lab Results  Component Value Date   LDLCALC 131 (H) 04/11/2015   Lab Results  Component Value Date   TRIG 172 (H) 04/11/2015   Lab Results  Component Value Date   CHOLHDL 4.4 04/11/2015   No results found for: HGBA1C    Assessment & Plan:   Problem List Items Addressed This Visit      Nervous and Auditory   Abnormal exam  of both ears   Relevant Orders   Ambulatory referral to ENT     Other   Elevated cholesterol   Healthcare maintenance - Primary   Relevant Orders   CBC   Comprehensive metabolic panel   LDL cholesterol, direct   Lipid panel   Urinalysis, Routine w reflex microscopic   HIV Antibody (routine testing w rflx)      No orders of the defined types were placed in this encounter.   Follow-up: Return return fasting for blood work..   Given information on health maintenance disease prevention as well as managing and preventing high cholesterol.  ENT consultation or "dual ear canals".  Will return fasting for above ordered blood work.  Stressed the importance that he be fasting for his lab work.  Advised him that MSG is associated with headaches and he may want to use another food seasoning. 04/13/2015, MD

## 2020-01-28 ENCOUNTER — Encounter: Payer: Self-pay | Admitting: Family Medicine

## 2020-02-06 ENCOUNTER — Other Ambulatory Visit: Payer: Self-pay

## 2020-02-06 ENCOUNTER — Other Ambulatory Visit (INDEPENDENT_AMBULATORY_CARE_PROVIDER_SITE_OTHER): Payer: BC Managed Care – PPO

## 2020-02-06 DIAGNOSIS — Z0000 Encounter for general adult medical examination without abnormal findings: Secondary | ICD-10-CM | POA: Diagnosis not present

## 2020-02-06 LAB — LIPID PANEL
Cholesterol: 226 mg/dL — ABNORMAL HIGH (ref 0–200)
HDL: 47.1 mg/dL (ref >39.00)
LDL Cholesterol: 152 mg/dL — ABNORMAL HIGH (ref 0–99)
NonHDL: 178.59
Total CHOL/HDL Ratio: 5
Triglycerides: 133 mg/dL (ref 0.0–149.0)
VLDL: 26.6 mg/dL (ref 0.0–40.0)

## 2020-02-06 LAB — URINALYSIS, ROUTINE W REFLEX MICROSCOPIC
Bilirubin Urine: NEGATIVE
Hgb urine dipstick: NEGATIVE
Ketones, ur: NEGATIVE
Leukocytes,Ua: NEGATIVE
Nitrite: NEGATIVE
RBC / HPF: NONE SEEN (ref 0–?)
Specific Gravity, Urine: 1.01 (ref 1.000–1.030)
Total Protein, Urine: NEGATIVE
Urine Glucose: NEGATIVE
Urobilinogen, UA: 0.2 (ref 0.0–1.0)
pH: 6 (ref 5.0–8.0)

## 2020-02-06 LAB — COMPREHENSIVE METABOLIC PANEL
ALT: 28 U/L (ref 0–53)
AST: 22 U/L (ref 0–37)
Albumin: 4.3 g/dL (ref 3.5–5.2)
Alkaline Phosphatase: 48 U/L (ref 39–117)
BUN: 18 mg/dL (ref 6–23)
CO2: 27 mEq/L (ref 19–32)
Calcium: 9.2 mg/dL (ref 8.4–10.5)
Chloride: 103 mEq/L (ref 96–112)
Creatinine, Ser: 1.08 mg/dL (ref 0.40–1.50)
GFR: 75.82 mL/min (ref >60.00)
Glucose, Bld: 94 mg/dL (ref 70–99)
Potassium: 4 mEq/L (ref 3.5–5.1)
Sodium: 138 mEq/L (ref 135–145)
Total Bilirubin: 0.6 mg/dL (ref 0.2–1.2)
Total Protein: 6.6 g/dL (ref 6.0–8.3)

## 2020-02-06 LAB — CBC
HCT: 44.3 % (ref 39.0–52.0)
Hemoglobin: 15 g/dL (ref 13.0–17.0)
MCHC: 33.8 g/dL (ref 30.0–36.0)
MCV: 83.6 fl (ref 78.0–100.0)
Platelets: 201 10*3/uL (ref 150.0–400.0)
RBC: 5.3 Mil/uL (ref 4.22–5.81)
RDW: 13 % (ref 11.5–15.5)
WBC: 4.9 10*3/uL (ref 4.0–10.5)

## 2020-02-06 LAB — LDL CHOLESTEROL, DIRECT: Direct LDL: 135 mg/dL

## 2020-02-07 LAB — HIV ANTIBODY (ROUTINE TESTING W REFLEX): HIV 1&2 Ab, 4th Generation: NONREACTIVE

## 2020-02-17 ENCOUNTER — Ambulatory Visit: Payer: BLUE CROSS/BLUE SHIELD | Admitting: Registered Nurse

## 2020-07-18 DIAGNOSIS — Z20822 Contact with and (suspected) exposure to covid-19: Secondary | ICD-10-CM | POA: Diagnosis not present

## 2020-07-23 DIAGNOSIS — Z20822 Contact with and (suspected) exposure to covid-19: Secondary | ICD-10-CM | POA: Diagnosis not present

## 2021-04-02 ENCOUNTER — Telehealth: Payer: Self-pay | Admitting: Family Medicine

## 2021-06-13 ENCOUNTER — Encounter: Payer: BC Managed Care – PPO | Admitting: Family Medicine

## 2021-06-20 ENCOUNTER — Other Ambulatory Visit: Payer: Self-pay

## 2021-06-20 ENCOUNTER — Ambulatory Visit (INDEPENDENT_AMBULATORY_CARE_PROVIDER_SITE_OTHER): Payer: BC Managed Care – PPO | Admitting: Family Medicine

## 2021-06-20 ENCOUNTER — Encounter: Payer: Self-pay | Admitting: Family Medicine

## 2021-06-20 VITALS — BP 110/72 | HR 70 | Temp 97.8°F | Ht 65.0 in | Wt 174.2 lb

## 2021-06-20 DIAGNOSIS — Z0000 Encounter for general adult medical examination without abnormal findings: Secondary | ICD-10-CM

## 2021-06-20 NOTE — Progress Notes (Signed)
Established Patient Office Visit  Subjective:  Patient ID: Parker Rogers, male    DOB: 12/17/1980  Age: 40 y.o. MRN: [id number]  CC:  Chief Complaint  Patient presents with   Annual Exam    CPE, o concerns. Patient not fasting.     HPI Parker Rogers presents for a complete physical exam.  He has been doing well.  Has not been able to exercise as much due to the colder weather.  Continues to work hard.  Is 65-1/2-year-old is doing well.  Work is been a little stressful.  He has felt as though he is a little bit more irritable than usual.  Status post COVID back in January.  He had developed palpitations after that illness and it since resolved.  Mom has what sounds like inflammatory bowel disease.  Dad is in relatively good health.  Past Medical History:  Diagnosis Date   Fracture of forearm, closed 1999   left arm   Hyperlipidemia     Past Surgical History:  Procedure Laterality Date   CLOSED REDUCTION FOREARM FRACTURE  1999   left forearm   FRACTURE SURGERY  1999   Left arm   WISDOM TOOTH EXTRACTION  2016    Family History  Problem Relation Age of Onset   Hyperlipidemia Father     Social History   Socioeconomic History   Marital status: Married    Spouse name: Not on file   Number of children: Not on file   Years of education: Not on file   Highest education level: Not on file  Occupational History   Occupation: Social research officer, government  Tobacco Use   Smoking status: Never   Smokeless tobacco: Never  Substance and Sexual Activity   Alcohol use: Yes    Alcohol/week: 1.0 standard drink    Types: 1 Standard drinks or equivalent per week    Comment: socially   Drug use: No   Sexual activity: Yes  Other Topics Concern   Not on file  Social History Narrative   Married 02/2014, just came back from his honeymoon. No children yet. 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 Eats healthy and exercises regularly.   Social  Determinants of Health   Financial Resource Strain: Not on file  Food Insecurity: Not on file  Transportation Needs: Not on file  Physical Activity: Not on file  Stress: Not on file  Social Connections: Not on file  Intimate Partner Violence: Not on file    Outpatient Medications Prior to Visit  Medication Sig Dispense Refill   Glucosamine HCl (GLUCOSAMINE PO) Take by mouth daily.     Multiple Vitamin (MULTIVITAMIN) tablet Take 1 tablet by mouth daily.     Omega-3 Fatty Acids (FISH OIL) 1000 MG CAPS Take by mouth daily.     fluticasone (FLONASE) 50 MCG/ACT nasal spray Place 2 sprays into both nostrils daily. (Patient not taking: Reported on 01/27/2020) 16 g 11   loratadine (CLARITIN) 10 MG tablet Take 1 tablet (10 mg total) by mouth daily. (Patient not taking: Reported on 01/27/2020) 30 tablet 11   ranitidine (ZANTAC) 150 MG capsule Take 1 capsule (150 mg total) by mouth every evening. (Patient not taking: Reported on 01/27/2020) 30 capsule 11   No facility-administered medications prior to visit.    No Known Allergies  ROS Review of Systems  Constitutional:  Negative for chills, diaphoresis, fatigue, fever and unexpected weight change.  HENT: Negative.    Eyes:  Negative for photophobia and visual disturbance.  Respiratory: Negative.    Cardiovascular: Negative.   Gastrointestinal: Negative.   Endocrine: Negative for polyphagia and polyuria.  Genitourinary: Negative.   Musculoskeletal:  Negative for gait problem and joint swelling.  Neurological:  Negative for speech difficulty, weakness and light-headedness.  Psychiatric/Behavioral:  Negative for dysphoric mood. The patient is not nervous/anxious.      Objective:    Physical Exam Vitals and nursing note reviewed.  Constitutional:      General: He is not in acute distress.    Appearance: Normal appearance. He is not ill-appearing, toxic-appearing or diaphoretic.  HENT:     Head: Normocephalic and atraumatic.     Right Ear:  Tympanic membrane, ear canal and external ear normal.     Left Ear: Tympanic membrane, ear canal and external ear normal.     Mouth/Throat:     Mouth: Mucous membranes are moist.     Pharynx: Oropharynx is clear. No oropharyngeal exudate or posterior oropharyngeal erythema.  Eyes:     General: No scleral icterus.       Right eye: No discharge.        Left eye: No discharge.     Extraocular Movements: Extraocular movements intact.     Conjunctiva/sclera: Conjunctivae normal.     Pupils: Pupils are equal, round, and reactive to light.  Neck:     Vascular: No carotid bruit.  Cardiovascular:     Rate and Rhythm: Normal rate and regular rhythm.  Pulmonary:     Effort: Pulmonary effort is normal.     Breath sounds: Normal breath sounds.  Abdominal:     General: Abdomen is flat. Bowel sounds are normal. There is no distension.     Palpations: Abdomen is soft. There is no mass.     Tenderness: There is no abdominal tenderness. There is no guarding or rebound.     Hernia: No hernia is present.  Musculoskeletal:     Cervical back: No rigidity or tenderness.     Right lower leg: No edema.     Left lower leg: No edema.  Lymphadenopathy:     Cervical: No cervical adenopathy.  Skin:    General: Skin is warm and dry.  Neurological:     Mental Status: He is alert and oriented to person, place, and time.  Psychiatric:        Mood and Affect: Mood normal.        Behavior: Behavior normal.    BP 110/72 (BP Location: Right Arm, Patient Position: Sitting, Cuff Size: Normal)    Pulse 70    Temp 97.8 F (36.6 C) (Temporal)    Ht 5\' 5"  (1.651 m)    Wt 174 lb 3.2 oz (79 kg)    SpO2 97%    BMI 28.99 kg/m  Wt Readings from Last 3 Encounters:  06/20/21 174 lb 3.2 oz (79 kg)  01/27/20 163 lb (73.9 kg)  07/12/15 174 lb (78.9 kg)     Health Maintenance Due  Topic Date Due   Hepatitis C Screening  Never done   COVID-19 Vaccine (3 - Booster for Pfizer series) 11/26/2019    There are no  preventive care reminders to display for this patient.  Lab Results  Component Value Date   TSH 1.228 04/11/2015   Lab Results  Component Value Date   WBC 4.9 02/06/2020   HGB 15.0 02/06/2020   HCT 44.3 02/06/2020   MCV 83.6 02/06/2020   PLT 201.0 02/06/2020  Lab Results  Component Value Date   NA 138 02/06/2020   K 4.0 02/06/2020   CO2 27 02/06/2020   GLUCOSE 94 02/06/2020   BUN 18 02/06/2020   CREATININE 1.08 02/06/2020   BILITOT 0.6 02/06/2020   ALKPHOS 48 02/06/2020   AST 22 02/06/2020   ALT 28 02/06/2020   PROT 6.6 02/06/2020   ALBUMIN 4.3 02/06/2020   CALCIUM 9.2 02/06/2020   GFR 75.82 02/06/2020   Lab Results  Component Value Date   CHOL 226 (H) 02/06/2020   Lab Results  Component Value Date   HDL 47.10 02/06/2020   Lab Results  Component Value Date   LDLCALC 152 (H) 02/06/2020   Lab Results  Component Value Date   TRIG 133.0 02/06/2020   Lab Results  Component Value Date   CHOLHDL 5 02/06/2020   No results found for: HGBA1C   The 10-year ASCVD risk score (Arnett DK, et al., 2019) is: 1.3%   Values used to calculate the score:     Age: 58 years     Sex: Male     Is Non-Hispanic African American: No     Diabetic: No     Tobacco smoker: No     Systolic Blood Pressure: 110 mmHg     Is BP treated: No     HDL Cholesterol: 47.1 mg/dL     Total Cholesterol: 226 mg/dL  Assessment & Plan:   Problem List Items Addressed This Visit       Other   Healthcare maintenance - Primary   Relevant Orders   Comprehensive metabolic panel   CBC   Lipid panel   Urinalysis, Routine w reflex microscopic    No orders of the defined types were placed in this encounter.   Follow-up: Return in about 1 year (around 06/20/2022), or if symptoms worsen or fail to improve.  Given information on health maintenance and preventative care.  Also given information on mindfulness based stress reduction.  Suggested exercise and look into offered employee assistance  programs.  He will return fasting for above ordered blood work.  Mliss Sax, MD

## 2021-06-21 ENCOUNTER — Other Ambulatory Visit (INDEPENDENT_AMBULATORY_CARE_PROVIDER_SITE_OTHER): Payer: BC Managed Care – PPO

## 2021-06-21 DIAGNOSIS — Z0000 Encounter for general adult medical examination without abnormal findings: Secondary | ICD-10-CM | POA: Diagnosis not present

## 2021-06-21 LAB — LIPID PANEL
Cholesterol: 287 mg/dL — ABNORMAL HIGH (ref 0–200)
HDL: 51.8 mg/dL (ref >39.00)
LDL Cholesterol: 210 mg/dL — ABNORMAL HIGH (ref 0–99)
NonHDL: 235.27
Total CHOL/HDL Ratio: 6
Triglycerides: 128 mg/dL (ref 0.0–149.0)
VLDL: 25.6 mg/dL (ref 0.0–40.0)

## 2021-06-21 LAB — URINALYSIS, ROUTINE W REFLEX MICROSCOPIC
Bilirubin Urine: NEGATIVE
Hgb urine dipstick: NEGATIVE
Ketones, ur: NEGATIVE
Leukocytes,Ua: NEGATIVE
Nitrite: NEGATIVE
RBC / HPF: NONE SEEN (ref 0–?)
Specific Gravity, Urine: 1.025 (ref 1.000–1.030)
Total Protein, Urine: NEGATIVE
Urine Glucose: NEGATIVE
Urobilinogen, UA: 0.2 (ref 0.0–1.0)
pH: 6 (ref 5.0–8.0)

## 2021-06-21 LAB — COMPREHENSIVE METABOLIC PANEL
ALT: 29 U/L (ref 0–53)
AST: 25 U/L (ref 0–37)
Albumin: 4.2 g/dL (ref 3.5–5.2)
Alkaline Phosphatase: 41 U/L (ref 39–117)
BUN: 18 mg/dL (ref 6–23)
CO2: 31 mEq/L (ref 19–32)
Calcium: 9.2 mg/dL (ref 8.4–10.5)
Chloride: 104 mEq/L (ref 96–112)
Creatinine, Ser: 1.12 mg/dL (ref 0.40–1.50)
GFR: 81.82 mL/min (ref >60.00)
Glucose, Bld: 99 mg/dL (ref 70–99)
Potassium: 4 mEq/L (ref 3.5–5.1)
Sodium: 140 mEq/L (ref 135–145)
Total Bilirubin: 0.7 mg/dL (ref 0.2–1.2)
Total Protein: 6.7 g/dL (ref 6.0–8.3)

## 2021-06-21 LAB — CBC
HCT: 46.2 % (ref 39.0–52.0)
Hemoglobin: 15.2 g/dL (ref 13.0–17.0)
MCHC: 32.8 g/dL (ref 30.0–36.0)
MCV: 84.1 fl (ref 78.0–100.0)
Platelets: 227 10*3/uL (ref 150.0–400.0)
RBC: 5.49 Mil/uL (ref 4.22–5.81)
RDW: 13.6 % (ref 11.5–15.5)
WBC: 4.4 10*3/uL (ref 4.0–10.5)

## 2021-06-21 NOTE — Progress Notes (Signed)
Per the orders of Dr.Kremer pt is here for labs pt tolerated draw well. Pt was able to provide adequate amount of urine for urine sample.

## 2021-06-25 ENCOUNTER — Encounter: Payer: Self-pay | Admitting: Family Medicine

## 2022-02-14 NOTE — Telephone Encounter (Signed)
Na

## 2022-02-25 ENCOUNTER — Encounter: Payer: BC Managed Care – PPO | Admitting: Family Medicine

## 2022-06-24 ENCOUNTER — Emergency Department (HOSPITAL_BASED_OUTPATIENT_CLINIC_OR_DEPARTMENT_OTHER): Payer: BC Managed Care – PPO

## 2022-06-24 ENCOUNTER — Emergency Department (HOSPITAL_BASED_OUTPATIENT_CLINIC_OR_DEPARTMENT_OTHER)
Admission: EM | Admit: 2022-06-24 | Discharge: 2022-06-24 | Disposition: A | Discharge status: Home / Self Care | Payer: BC Managed Care – PPO | Attending: Student | Admitting: Student

## 2022-06-24 ENCOUNTER — Encounter (HOSPITAL_BASED_OUTPATIENT_CLINIC_OR_DEPARTMENT_OTHER): Payer: Self-pay

## 2022-06-24 ENCOUNTER — Other Ambulatory Visit: Payer: Self-pay

## 2022-06-24 DIAGNOSIS — R1032 Left lower quadrant pain: Secondary | ICD-10-CM | POA: Diagnosis not present

## 2022-06-24 DIAGNOSIS — N200 Calculus of kidney: Secondary | ICD-10-CM

## 2022-06-24 DIAGNOSIS — R220 Localized swelling, mass and lump, head: Secondary | ICD-10-CM | POA: Insufficient documentation

## 2022-06-24 DIAGNOSIS — M47816 Spondylosis without myelopathy or radiculopathy, lumbar region: Secondary | ICD-10-CM | POA: Diagnosis not present

## 2022-06-24 DIAGNOSIS — N1330 Unspecified hydronephrosis: Secondary | ICD-10-CM | POA: Diagnosis not present

## 2022-06-24 DIAGNOSIS — N202 Calculus of kidney with calculus of ureter: Secondary | ICD-10-CM | POA: Diagnosis not present

## 2022-06-24 DIAGNOSIS — R109 Unspecified abdominal pain: Secondary | ICD-10-CM | POA: Diagnosis not present

## 2022-06-24 DIAGNOSIS — Z5321 Procedure and treatment not carried out due to patient leaving prior to being seen by health care provider: Secondary | ICD-10-CM | POA: Insufficient documentation

## 2022-06-24 LAB — CBC WITH DIFFERENTIAL/PLATELET
Abs Immature Granulocytes: 0.04 10*3/uL (ref 0.00–0.07)
Basophils Absolute: 0.1 10*3/uL (ref 0.0–0.1)
Basophils Relative: 1 %
Eosinophils Absolute: 0.3 10*3/uL (ref 0.0–0.5)
Eosinophils Relative: 4 %
HCT: 44.6 % (ref 39.0–52.0)
Hemoglobin: 14.7 g/dL (ref 13.0–17.0)
Immature Granulocytes: 1 %
Lymphocytes Relative: 44 %
Lymphs Abs: 3.7 10*3/uL (ref 0.7–4.0)
MCH: 27.5 pg (ref 26.0–34.0)
MCHC: 33 g/dL (ref 30.0–36.0)
MCV: 83.4 fL (ref 80.0–100.0)
Monocytes Absolute: 0.7 10*3/uL (ref 0.1–1.0)
Monocytes Relative: 8 %
Neutro Abs: 3.4 10*3/uL (ref 1.7–7.7)
Neutrophils Relative %: 42 %
Platelets: 217 10*3/uL (ref 150–400)
RBC: 5.35 MIL/uL (ref 4.22–5.81)
RDW: 13 % (ref 11.5–15.5)
WBC: 8.2 10*3/uL (ref 4.0–10.5)
nRBC: 0 % (ref 0.0–0.2)

## 2022-06-24 LAB — URINALYSIS, ROUTINE W REFLEX MICROSCOPIC
Bilirubin Urine: NEGATIVE
Glucose, UA: NEGATIVE mg/dL
Ketones, ur: NEGATIVE mg/dL
Leukocytes,Ua: NEGATIVE
Nitrite: NEGATIVE
Protein, ur: NEGATIVE mg/dL
Specific Gravity, Urine: 1.015 (ref 1.005–1.030)
pH: 7.5 (ref 5.0–8.0)

## 2022-06-24 LAB — COMPREHENSIVE METABOLIC PANEL
ALT: 39 U/L (ref 0–44)
AST: 34 U/L (ref 15–41)
Albumin: 4.1 g/dL (ref 3.5–5.0)
Alkaline Phosphatase: 49 U/L (ref 38–126)
Anion gap: 9 (ref 5–15)
BUN: 27 mg/dL — ABNORMAL HIGH (ref 6–20)
CO2: 29 mmol/L (ref 22–32)
Calcium: 9.2 mg/dL (ref 8.9–10.3)
Chloride: 100 mmol/L (ref 98–111)
Creatinine, Ser: 1.4 mg/dL — ABNORMAL HIGH (ref 0.61–1.24)
GFR, Estimated: >60 mL/min (ref >60)
Glucose, Bld: 116 mg/dL — ABNORMAL HIGH (ref 70–99)
Potassium: 3.2 mmol/L — ABNORMAL LOW (ref 3.5–5.1)
Sodium: 138 mmol/L (ref 135–145)
Total Bilirubin: 0.5 mg/dL (ref 0.3–1.2)
Total Protein: 7.2 g/dL (ref 6.5–8.1)

## 2022-06-24 LAB — URINALYSIS, MICROSCOPIC (REFLEX): RBC / HPF: >50 RBC/hpf (ref 0–5)

## 2022-06-24 MED ORDER — NAPROXEN 375 MG PO TABS: 375.0000 mg | ORAL_TABLET | Freq: Two times a day (BID) | ORAL | 0 refills | Status: AC

## 2022-06-24 MED ORDER — LACTATED RINGERS IV BOLUS
1000.0000 mL | Freq: Once | INTRAVENOUS | Status: AC
  Administered 2022-06-24: 1000 mL via INTRAVENOUS

## 2022-06-24 MED ORDER — HYDROCODONE-ACETAMINOPHEN 5-325 MG PO TABS: 1.0000 | ORAL_TABLET | Freq: Four times a day (QID) | ORAL | 0 refills | Status: DC | PRN

## 2022-06-24 MED ORDER — ONDANSETRON HCL 4 MG/2ML IJ SOLN
4.0000 mg | Freq: Once | INTRAMUSCULAR | Status: AC
  Administered 2022-06-24: 4 mg via INTRAVENOUS
  Filled 2022-06-24: qty 2

## 2022-06-24 MED ORDER — TAMSULOSIN HCL 0.4 MG PO CAPS: 0.4000 mg | ORAL_CAPSULE | Freq: Every day | ORAL | 0 refills | Status: AC

## 2022-06-24 MED ORDER — KETOROLAC TROMETHAMINE 15 MG/ML IJ SOLN
15.0000 mg | Freq: Once | INTRAMUSCULAR | Status: AC
  Administered 2022-06-24: 15 mg via INTRAVENOUS
  Filled 2022-06-24: qty 1

## 2022-06-24 MED ORDER — MORPHINE SULFATE (PF) 4 MG/ML IV SOLN
4.0000 mg | Freq: Once | INTRAVENOUS | Status: AC
  Administered 2022-06-24: 4 mg via INTRAVENOUS
  Filled 2022-06-24: qty 1

## 2022-06-24 MED ORDER — TAMSULOSIN HCL 0.4 MG PO CAPS
0.4000 mg | ORAL_CAPSULE | Freq: Once | ORAL | Status: AC
  Administered 2022-06-24: 0.4 mg via ORAL
  Filled 2022-06-24: qty 1

## 2022-06-24 NOTE — ED Provider Notes (Signed)
[facility] EMERGENCY DEPARTMENT Provider Note  CSN: [id number] Arrival date & time: 06/24/22 0227  Chief Complaint(s) Flank Pain  HPI Parker Rogers is a 42 y.o. male who presents emergency department for evaluation of left flank and abdominal pain.  Patient states that his pain began abruptly at 2 AM on 06/24/2022 and radiates down towards the bladder.  Denies dysuria, increased frequency, nausea, vomiting or other systemic symptoms.   Past Medical History Past Medical History:  Diagnosis Date   Fracture of forearm, closed 1999   left arm   Hyperlipidemia    Patient Active Problem List   Diagnosis Date Noted   Elevated cholesterol 01/27/2020   Healthcare maintenance 01/27/2020   Abnormal exam of both ears 01/27/2020   Other allergic rhinitis 07/12/2015   Home Medication(s) Prior to Admission medications   Medication Sig Start Date End Date Taking? Authorizing Provider  Glucosamine HCl (GLUCOSAMINE PO) Take by mouth daily.    [provider]  Multiple Vitamin (MULTIVITAMIN) tablet Take 1 tablet by mouth daily.    [provider]  Omega-3 Fatty Acids (FISH OIL) 1000 MG CAPS Take by mouth daily.    [provider]                                                                                                                                    Past Surgical History Past Surgical History:  Procedure Laterality Date   CLOSED REDUCTION FOREARM FRACTURE  1999   left forearm   FRACTURE SURGERY  1999   Left arm   WISDOM TOOTH EXTRACTION  2016   Family History Family History  Problem Relation Age of Onset   Hyperlipidemia Father     Social History Social History   Tobacco Use   Smoking status: Never   Smokeless tobacco: Never  Substance Use Topics   Alcohol use: Yes    Alcohol/week: 1.0 standard drink of alcohol    Types: 1 Standard drinks or equivalent per week    Comment: socially   Drug use: No   Allergies Patient has no known  allergies.  Review of Systems Review of Systems  Gastrointestinal:  Positive for abdominal pain.  Genitourinary:  Positive for flank pain.    Physical Exam Vital Signs  I have reviewed the triage vital signs BP (!) 131/96 (BP Location: Right Arm)   Pulse 74   Temp 97.6 F (36.4 C) (Oral)   Resp 17   Ht 5\' 6"  (1.676 m)   Wt 74.8 kg   SpO2 100%   BMI 26.63 kg/m   Physical Exam Constitutional:      General: He is not in acute distress.    Appearance: Normal appearance.  HENT:     Head: Normocephalic and atraumatic.     Nose: No congestion or rhinorrhea.  Eyes:     General:        Right eye: No discharge.  Left eye: No discharge.     Extraocular Movements: Extraocular movements intact.     Pupils: Pupils are equal, round, and reactive to light.  Cardiovascular:     Rate and Rhythm: Normal rate and regular rhythm.     Heart sounds: No murmur heard. Pulmonary:     Effort: No respiratory distress.     Breath sounds: No wheezing or rales.  Abdominal:     General: There is no distension.     Tenderness: There is no abdominal tenderness. There is left CVA tenderness.  Musculoskeletal:        General: Normal range of motion.     Cervical back: Normal range of motion.  Skin:    General: Skin is warm and dry.  Neurological:     General: No focal deficit present.     Mental Status: He is alert.     ED Results and Treatments Labs (all labs ordered are listed, but only abnormal results are displayed) Labs Reviewed  COMPREHENSIVE METABOLIC PANEL - Abnormal; Notable for the following components:      Result Value   Potassium 3.2 (*)    Glucose, Bld 116 (*)    BUN 27 (*)    Creatinine, Ser 1.40 (*)    All other components within normal limits  CBC WITH DIFFERENTIAL/PLATELET  URINALYSIS, ROUTINE W REFLEX MICROSCOPIC                                                                                                                          Radiology CT Renal Stone  Study  Result Date: 06/24/2022 CLINICAL DATA:  Left flank pain. EXAM: CT ABDOMEN AND PELVIS WITHOUT CONTRAST TECHNIQUE: Multidetector CT imaging of the abdomen and pelvis was performed following the standard protocol without IV contrast. RADIATION DOSE REDUCTION: This exam was performed according to the departmental dose-optimization program which includes automated exposure control, adjustment of the mA and/or kV according to patient size and/or use of iterative reconstruction technique. COMPARISON:  None Available. FINDINGS: Lower chest: No acute abnormality. Hepatobiliary: No focal liver abnormality is seen. No gallstones, gallbladder wall thickening, or biliary dilatation. Pancreas: Unremarkable. No pancreatic ductal dilatation or surrounding inflammatory changes. Spleen: Normal in size without focal abnormality. Adrenals/Urinary Tract: Adrenal glands are unremarkable. Kidneys are normal in size, without focal lesions. A 2 mm obstructing renal calculus is seen within the distal left ureter, with mild left-sided hydronephrosis and hydroureter. A 5 mm nonobstructing renal calculus is seen within the mid left kidney. Bladder is unremarkable. Stomach/Bowel: Stomach is within normal limits. Appendix appears normal. No evidence of bowel wall thickening, distention, or inflammatory changes. Vascular/Lymphatic: No significant vascular findings are present. No enlarged abdominal or pelvic lymph nodes. Reproductive: Prostate is unremarkable. Other: No abdominal wall hernia or abnormality. No abdominopelvic ascites. Musculoskeletal: Multilevel degenerative changes are seen throughout the lumbar spine. IMPRESSION: 1. 2 mm obstructing renal calculus within the distal left ureter. 2. 5 mm nonobstructing left renal calculus. Electronically Signed  By: Virgina Norfolk M.D.   On: 06/24/2022 02:56    Pertinent labs & imaging results that were available during my care of the patient were reviewed by me and considered in my  medical decision making (see MDM for details).  Medications Ordered in ED Medications  ketorolac (TORADOL) 15 MG/ML injection 15 mg (15 mg Intravenous Given 06/24/22 0256)  lactated ringers bolus 1,000 mL ( Intravenous Infusion Verify 06/24/22 0337)  tamsulosin (FLOMAX) capsule 0.4 mg (0.4 mg Oral Given 06/24/22 0322)                                                                                                                                     Procedures Procedures  (including critical care time)  Medical Decision Making / ED Course   This patient presents to the ED for concern of flank pain, this involves an extensive number of treatment options, and is a complaint that carries with it a high risk of complications and morbidity.  The differential diagnosis includes nephrolithiasis, pyelonephritis, cystitis, diverticulitis, muscle strain  MDM: Patient seen in the emergency department for evaluation of flank pain.  Physical exam with left CVA tenderness and mild left lower quadrant tenderness.  Laboratory evaluation with a BUN of 27, creatinine 1.4, potassium 3.2, urinalysis with large blood and no white blood cells.  CT stone study showing a 2 mm obstructing renal calculus within the distal left ureter as well as a 5 mm nonobstructing left renal calculus.  Patient given fluid resuscitation, pain control and Flomax and will require outpatient follow-up.  Patient remained hemodynamically stable on reevaluation and patient discharged with a urine strainer and outpatient urology follow-up.  Amatory referral placed.   Additional history obtained:  -External records from outside source obtained and reviewed including: Chart review including previous notes, labs, imaging, consultation notes   Lab Tests: -I ordered, reviewed, and interpreted labs.   The pertinent results include:   Labs Reviewed  COMPREHENSIVE METABOLIC PANEL - Abnormal; Notable for the following components:      Result Value    Potassium 3.2 (*)    Glucose, Bld 116 (*)    BUN 27 (*)    Creatinine, Ser 1.40 (*)    All other components within normal limits  CBC WITH DIFFERENTIAL/PLATELET  URINALYSIS, ROUTINE W REFLEX MICROSCOPIC      Imaging Studies ordered: I ordered imaging studies including CT stone study I independently visualized and interpreted imaging. I agree with the radiologist interpretation   Medicines ordered and prescription drug management: Meds ordered this encounter  Medications   ketorolac (TORADOL) 15 MG/ML injection 15 mg   lactated ringers bolus 1,000 mL   tamsulosin (FLOMAX) capsule 0.4 mg    -I have reviewed the patients home medicines and have made adjustments as needed  Critical interventions none    Cardiac Monitoring: The patient was maintained on a cardiac monitor.  I personally viewed and interpreted  the cardiac monitored which showed an underlying rhythm of: NSR  Social Determinants of Health:  Factors impacting patients care include: none   Reevaluation: After the interventions noted above, I reevaluated the patient and found that they have :improved  Co morbidities that complicate the patient evaluation  Past Medical History:  Diagnosis Date   Fracture of forearm, closed 1999   left arm   Hyperlipidemia       Dispostion: I considered admission for this patient, but he currently does not meet inpatient criteria for admission and he is safe for discharge outpatient follow-up     Final Clinical Impression(s) / ED Diagnoses Final diagnoses:  None     @PCDICTATION @    , MD 06/24/22 [phone]

## 2022-06-24 NOTE — ED Triage Notes (Signed)
PER EMS: pt reports left flank pain onset 30 mins PTA that radiates to his abdomen. Denies n/v/d/hematuria/dysuria.

## 2022-06-25 ENCOUNTER — Encounter (HOSPITAL_BASED_OUTPATIENT_CLINIC_OR_DEPARTMENT_OTHER): Payer: Self-pay | Admitting: Emergency Medicine

## 2022-06-25 ENCOUNTER — Emergency Department (HOSPITAL_BASED_OUTPATIENT_CLINIC_OR_DEPARTMENT_OTHER)
Admission: EM | Admit: 2022-06-25 | Discharge: 2022-06-25 | Discharge status: Against Medical Advice | Payer: BC Managed Care – PPO | Attending: Emergency Medicine | Admitting: Emergency Medicine

## 2022-06-25 NOTE — ED Triage Notes (Signed)
Pt is concerned that he might be having an allergic reaction to the medication that was prescribed earlier the morning.  He states after he took it, he woke up from a nap and his face was swollen, it is not now.  He states that he now feels like he is "retaining fluid."

## 2022-06-25 NOTE — ED Notes (Signed)
Called pt to take to room  no response from lobby

## 2022-06-26 DIAGNOSIS — N202 Calculus of kidney with calculus of ureter: Secondary | ICD-10-CM | POA: Diagnosis not present

## 2022-06-27 DIAGNOSIS — N202 Calculus of kidney with calculus of ureter: Secondary | ICD-10-CM | POA: Diagnosis not present

## 2022-07-14 DIAGNOSIS — N202 Calculus of kidney with calculus of ureter: Secondary | ICD-10-CM | POA: Diagnosis not present

## 2022-07-14 DIAGNOSIS — R1084 Generalized abdominal pain: Secondary | ICD-10-CM | POA: Diagnosis not present

## 2022-10-01 DIAGNOSIS — M2011 Hallux valgus (acquired), right foot: Secondary | ICD-10-CM | POA: Diagnosis not present

## 2022-10-01 DIAGNOSIS — M792 Neuralgia and neuritis, unspecified: Secondary | ICD-10-CM | POA: Diagnosis not present

## 2022-10-17 ENCOUNTER — Ambulatory Visit (INDEPENDENT_AMBULATORY_CARE_PROVIDER_SITE_OTHER): Payer: BC Managed Care – PPO | Admitting: Family Medicine

## 2022-10-17 ENCOUNTER — Encounter: Payer: Self-pay | Admitting: Family Medicine

## 2022-10-17 ENCOUNTER — Telehealth: Payer: Self-pay | Admitting: Family Medicine

## 2022-10-17 VITALS — BP 110/80 | HR 67 | Temp 98.1°F | Resp 17 | Ht 66.0 in | Wt 165.2 lb

## 2022-10-17 DIAGNOSIS — E7800 Pure hypercholesterolemia, unspecified: Secondary | ICD-10-CM

## 2022-10-17 DIAGNOSIS — Z0000 Encounter for general adult medical examination without abnormal findings: Secondary | ICD-10-CM

## 2022-10-17 LAB — URINALYSIS, ROUTINE W REFLEX MICROSCOPIC
Bilirubin Urine: NEGATIVE
Hgb urine dipstick: NEGATIVE
Ketones, ur: NEGATIVE
Leukocytes,Ua: NEGATIVE
Nitrite: NEGATIVE
Specific Gravity, Urine: 1.015 (ref 1.000–1.030)
Total Protein, Urine: NEGATIVE
Urine Glucose: NEGATIVE
Urobilinogen, UA: 0.2 (ref 0.0–1.0)
pH: 6.5 (ref 5.0–8.0)

## 2022-10-17 LAB — COMPREHENSIVE METABOLIC PANEL
ALT: 26 U/L (ref 0–53)
AST: 21 U/L (ref 0–37)
Albumin: 4.3 g/dL (ref 3.5–5.2)
Alkaline Phosphatase: 54 U/L (ref 39–117)
BUN: 16 mg/dL (ref 6–23)
CO2: 29 mEq/L (ref 19–32)
Calcium: 9.3 mg/dL (ref 8.4–10.5)
Chloride: 102 mEq/L (ref 96–112)
Creatinine, Ser: 1.18 mg/dL (ref 0.40–1.50)
GFR: 76.14 mL/min (ref >60.00)
Glucose, Bld: 91 mg/dL (ref 70–99)
Potassium: 4.4 mEq/L (ref 3.5–5.1)
Sodium: 138 mEq/L (ref 135–145)
Total Bilirubin: 0.6 mg/dL (ref 0.2–1.2)
Total Protein: 7 g/dL (ref 6.0–8.3)

## 2022-10-17 LAB — LIPID PANEL
Cholesterol: 231 mg/dL — ABNORMAL HIGH (ref 0–200)
HDL: 44.3 mg/dL (ref >39.00)
NonHDL: 186.75
Total CHOL/HDL Ratio: 5
Triglycerides: 212 mg/dL — ABNORMAL HIGH (ref 0.0–149.0)
VLDL: 42.4 mg/dL — ABNORMAL HIGH (ref 0.0–40.0)

## 2022-10-17 LAB — CBC
HCT: 47.5 % (ref 39.0–52.0)
Hemoglobin: 16.2 g/dL (ref 13.0–17.0)
MCHC: 34 g/dL (ref 30.0–36.0)
MCV: 83.6 fl (ref 78.0–100.0)
Platelets: 213 10*3/uL (ref 150.0–400.0)
RBC: 5.68 Mil/uL (ref 4.22–5.81)
RDW: 13.5 % (ref 11.5–15.5)
WBC: 4.7 10*3/uL (ref 4.0–10.5)

## 2022-10-17 LAB — LDL CHOLESTEROL, DIRECT: Direct LDL: 148 mg/dL

## 2022-10-17 NOTE — Telephone Encounter (Signed)
Patient stated while checking out that PCP mentioned having a CT scan done. Patient would like a mychart message or call in regards to having this form of imaging done.

## 2022-10-17 NOTE — Progress Notes (Addendum)
Established Patient Office Visit   Subjective:  Patient ID: Parker Rogers, male    DOB: 24-Dec-1980  Age: 42 y.o. MRN: [id number]  Chief Complaint  Patient presents with   Annual Exam    Annual exam  Passed a kidney stone recently sees Urology  Doing ok per pt     HPI Encounter Diagnoses  Name Primary?   Healthcare maintenance Yes   Elevated LDL cholesterol level    Here for physical.  Doing relatively well.  Status post kidney stone last month and has done well on follow-up.  Now has 2 small children in the home.  Works out of town up in [place].  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  Ongoing history of elevated ldl cholesterol.  He does exercise daily.  He has access to dental care and goes regularly.   Review of Systems  Constitutional: Negative.   HENT: Negative.    Eyes:  Negative for blurred vision, discharge and redness.  Respiratory: Negative.    Cardiovascular: Negative.   Gastrointestinal:  Negative for abdominal pain.  Genitourinary: Negative.   Musculoskeletal: Negative.  Negative for myalgias.  Skin:  Negative for rash.  Neurological:  Negative for tingling, loss of consciousness and weakness.  Endo/Heme/Allergies:  Negative for polydipsia.     Current Outpatient Medications:    Glucosamine HCl (GLUCOSAMINE PO), Take by mouth daily., Disp: , Rfl:    Multiple Vitamin (MULTIVITAMIN) tablet, Take 1 tablet by mouth daily., Disp: , Rfl:    naproxen (NAPROSYN) 375 MG tablet, Take 1 tablet (375 mg total) by mouth 2 (two) times daily., Disp: 20 tablet, Rfl: 0   Omega-3 Fatty Acids (FISH OIL) 1000 MG CAPS, Take by mouth daily., Disp: , Rfl:    tamsulosin (FLOMAX) 0.4 MG CAPS capsule, Take 1 capsule (0.4 mg total) by mouth daily., Disp: 7 capsule, Rfl: 0   HYDROcodone-acetaminophen (NORCO/VICODIN) 5-325 MG tablet, Take 1 tablet by mouth every 6 (six) hours as needed for severe pain. (Patient not taking: Reported on 10/17/2022), Disp: 10 tablet, Rfl: 0   Objective:      BP 110/80   Pulse 67   Temp 98.1 F (36.7 C) (Temporal)   Resp 17   Ht 5\' 6"  (1.676 m)   Wt 165 lb 4 oz (75 kg)   SpO2 100%   BMI 26.67 kg/m    Physical Exam Constitutional:      General: He is not in acute distress.    Appearance: Normal appearance. He is not ill-appearing, toxic-appearing or diaphoretic.  HENT:     Head: Normocephalic and atraumatic.     Right Ear: External ear normal.     Left Ear: External ear normal.     Mouth/Throat:     Mouth: Mucous membranes are moist.     Pharynx: Oropharynx is clear. No oropharyngeal exudate or posterior oropharyngeal erythema.  Eyes:     General: No scleral icterus.       Right eye: No discharge.        Left eye: No discharge.     Extraocular Movements: Extraocular movements intact.     Conjunctiva/sclera: Conjunctivae normal.     Pupils: Pupils are equal, round, and reactive to light.  Cardiovascular:     Rate and Rhythm: Normal rate and regular rhythm.  Pulmonary:     Effort: Pulmonary effort is normal. No respiratory distress.     Breath sounds: Normal breath sounds.  Abdominal:     General: Bowel sounds are  normal.     Tenderness: There is no abdominal tenderness. There is no guarding.     Hernia: There is no hernia in the left inguinal area or right inguinal area.  Genitourinary:    Penis: Uncircumcised. No phimosis, paraphimosis, hypospadias, erythema, tenderness or discharge.      Testes:        Right: Mass, tenderness or swelling not present. Right testis is descended.        Left: Mass, tenderness or swelling not present. Left testis is descended.     Epididymis:     Right: Not inflamed or enlarged.     Left: Not inflamed or enlarged.  Musculoskeletal:     Cervical back: No rigidity or tenderness.  Lymphadenopathy:     Cervical: No cervical adenopathy.     Lower Body: No right inguinal adenopathy. No left inguinal adenopathy.  Skin:    General: Skin is warm and dry.  Neurological:     Mental Status:  He is alert and oriented to person, place, and time.  Psychiatric:        Mood and Affect: Mood normal.        Behavior: Behavior normal.      No results found for any visits on 10/17/22.    The 10-year ASCVD risk score (Arnett DK, et al., 2019) is: 2.1%    Assessment & Plan:   Healthcare maintenance -     CBC -     Comprehensive metabolic panel -     Urinalysis, Routine w reflex microscopic  Elevated LDL cholesterol level -     Comprehensive metabolic panel -     Lipid panel -      Apolipoprotein Evaluation -     Lipoprotein A (LPA)    Return in about 6 months (around 04/18/2023).  We had a 20-minute chat about his elevated ldl cholesterol and with elevated risk for early vascular disease.  He would like to double down on lowering the fat and cholesterol in his diet.  Discussed the possibility of coronary artery calcium scoring.  Checking APO and LPA today.  Continue healthy active lifestyle.  Mliss Sax, MD  Addendum: Coronary calcium score at 110.  Increased risk for vascular disease.  Statin is highly recommended.  Will discuss at next visit.

## 2022-10-20 NOTE — Telephone Encounter (Signed)
Left detailed VM that CT was ordered and if they don't hear anything in a week to let us know.  Dm/cma

## 2022-10-21 LAB — LIPOPROTEIN A (LPA): Lipoprotein (a): 42 nmol/L (ref <75)

## 2022-10-21 LAB — APOLIPOPROTEIN EVALUATION
APOLIPOPROTEIN B/A1 RATIO: 0.84 — ABNORMAL HIGH (ref <0.77)
Apolipoprotein A-1: 148 mg/dL (ref >=115)
Apolipoprotein B: 125 mg/dL — ABNORMAL HIGH (ref <90)

## 2022-11-12 DIAGNOSIS — N202 Calculus of kidney with calculus of ureter: Secondary | ICD-10-CM | POA: Diagnosis not present

## 2022-11-12 DIAGNOSIS — R1084 Generalized abdominal pain: Secondary | ICD-10-CM | POA: Diagnosis not present

## 2022-11-13 ENCOUNTER — Ambulatory Visit (HOSPITAL_BASED_OUTPATIENT_CLINIC_OR_DEPARTMENT_OTHER)
Admission: RE | Admit: 2022-11-13 | Discharge: 2022-11-13 | Disposition: A | Discharge status: Home / Self Care | Payer: BC Managed Care – PPO | Source: Ambulatory Visit | Attending: Family Medicine | Admitting: Family Medicine

## 2022-11-13 DIAGNOSIS — E7800 Pure hypercholesterolemia, unspecified: Secondary | ICD-10-CM

## 2022-11-20 ENCOUNTER — Other Ambulatory Visit: Payer: Self-pay | Admitting: Urology

## 2022-11-26 ENCOUNTER — Encounter (HOSPITAL_BASED_OUTPATIENT_CLINIC_OR_DEPARTMENT_OTHER): Payer: Self-pay | Admitting: Urology

## 2022-11-26 NOTE — Progress Notes (Signed)
Talked with patient. Instructions given. Hx and meds reviewed. NPO after MN. Arrival time 0800. Driver secured

## 2022-11-28 ENCOUNTER — Encounter (HOSPITAL_BASED_OUTPATIENT_CLINIC_OR_DEPARTMENT_OTHER)
Admission: RE | Disposition: A | Discharge status: Home / Self Care | Payer: Self-pay | Source: Home / Self Care | Attending: Urology

## 2022-11-28 ENCOUNTER — Ambulatory Visit (HOSPITAL_BASED_OUTPATIENT_CLINIC_OR_DEPARTMENT_OTHER)
Admission: RE | Admit: 2022-11-28 | Discharge: 2022-11-28 | Disposition: A | Discharge status: Home / Self Care | Payer: BC Managed Care – PPO | Attending: Urology | Admitting: Urology

## 2022-11-28 ENCOUNTER — Encounter (HOSPITAL_BASED_OUTPATIENT_CLINIC_OR_DEPARTMENT_OTHER): Payer: Self-pay | Admitting: Urology

## 2022-11-28 ENCOUNTER — Ambulatory Visit (HOSPITAL_COMMUNITY): Payer: BC Managed Care – PPO

## 2022-11-28 ENCOUNTER — Other Ambulatory Visit: Payer: Self-pay

## 2022-11-28 DIAGNOSIS — N200 Calculus of kidney: Secondary | ICD-10-CM | POA: Insufficient documentation

## 2022-11-28 HISTORY — DX: Unspecified osteoarthritis, unspecified site: M19.90

## 2022-11-28 HISTORY — PX: EXTRACORPOREAL SHOCK WAVE LITHOTRIPSY: SHX1557

## 2022-11-28 SURGERY — LITHOTRIPSY, ESWL
Anesthesia: LOCAL | Laterality: Left

## 2022-11-28 MED ORDER — CIPROFLOXACIN HCL 500 MG PO TABS
500.0000 mg | ORAL_TABLET | ORAL | Status: AC
  Administered 2022-11-28: 500 mg via ORAL

## 2022-11-28 MED ORDER — ONDANSETRON HCL 4 MG PO TABS: 4.0000 mg | ORAL_TABLET | Freq: Every day | ORAL | 1 refills | Status: AC | PRN

## 2022-11-28 MED ORDER — CIPROFLOXACIN HCL 500 MG PO TABS
ORAL_TABLET | ORAL | Status: AC
  Filled 2022-11-28: qty 1

## 2022-11-28 MED ORDER — SODIUM CHLORIDE 0.9 % IV SOLN: INTRAVENOUS | Status: DC

## 2022-11-28 MED ORDER — DIPHENHYDRAMINE HCL 25 MG PO CAPS
ORAL_CAPSULE | ORAL | Status: AC
  Filled 2022-11-28: qty 1

## 2022-11-28 MED ORDER — DIPHENHYDRAMINE HCL 25 MG PO CAPS
25.0000 mg | ORAL_CAPSULE | ORAL | Status: AC
  Administered 2022-11-28: 25 mg via ORAL

## 2022-11-28 MED ORDER — OXYCODONE-ACETAMINOPHEN 5-325 MG PO TABS: 1.0000 | ORAL_TABLET | ORAL | 0 refills | Status: DC | PRN

## 2022-11-28 MED ORDER — DIAZEPAM 5 MG PO TABS
ORAL_TABLET | ORAL | Status: AC
  Filled 2022-11-28: qty 2

## 2022-11-28 MED ORDER — DIAZEPAM 5 MG PO TABS
10.0000 mg | ORAL_TABLET | ORAL | Status: AC
  Administered 2022-11-28: 10 mg via ORAL

## 2022-11-28 NOTE — H&P (Signed)
See scanned [facility] documents for H&P.   

## 2022-11-28 NOTE — Op Note (Signed)
[facility] Operative Note  Treating Physician: Rhoderick Moody, MD  Pre-op diagnosis: 5 mm LEFT RENAL STONE  Post-op diagnosis: Same   Procedure: LEFT ESWL  See [facility] OP note scanned into chart. Also because of the size, density, location and other factors that cannot be anticipated I feel this will likely be a staged procedure. This fact supersedes any indication in the scanned [facility] operative note to the contrary

## 2022-12-01 ENCOUNTER — Encounter (HOSPITAL_BASED_OUTPATIENT_CLINIC_OR_DEPARTMENT_OTHER): Payer: Self-pay | Admitting: Urology

## 2022-12-05 DIAGNOSIS — R1084 Generalized abdominal pain: Secondary | ICD-10-CM | POA: Diagnosis not present

## 2022-12-05 DIAGNOSIS — N200 Calculus of kidney: Secondary | ICD-10-CM | POA: Diagnosis not present

## 2023-04-20 ENCOUNTER — Ambulatory Visit (INDEPENDENT_AMBULATORY_CARE_PROVIDER_SITE_OTHER): Payer: BC Managed Care – PPO | Admitting: Family Medicine

## 2023-04-20 ENCOUNTER — Encounter: Payer: Self-pay | Admitting: Family Medicine

## 2023-04-20 VITALS — BP 102/74 | HR 80 | Temp 98.7°F | Ht 66.0 in | Wt 148.2 lb

## 2023-04-20 DIAGNOSIS — Z131 Encounter for screening for diabetes mellitus: Secondary | ICD-10-CM

## 2023-04-20 DIAGNOSIS — Z1389 Encounter for screening for other disorder: Secondary | ICD-10-CM

## 2023-04-20 DIAGNOSIS — E7800 Pure hypercholesterolemia, unspecified: Secondary | ICD-10-CM | POA: Diagnosis not present

## 2023-04-20 DIAGNOSIS — R931 Abnormal findings on diagnostic imaging of heart and coronary circulation: Secondary | ICD-10-CM

## 2023-04-20 LAB — BASIC METABOLIC PANEL
BUN: 19 mg/dL (ref 6–23)
CO2: 31 meq/L (ref 19–32)
Calcium: 9.5 mg/dL (ref 8.4–10.5)
Chloride: 103 meq/L (ref 96–112)
Creatinine, Ser: 1.1 mg/dL (ref 0.40–1.50)
GFR: 82.54 mL/min (ref >60.00)
Glucose, Bld: 86 mg/dL (ref 70–99)
Potassium: 4.1 meq/L (ref 3.5–5.1)
Sodium: 140 meq/L (ref 135–145)

## 2023-04-20 LAB — LIPID PANEL
Cholesterol: 186 mg/dL (ref 0–200)
HDL: 62.3 mg/dL (ref >39.00)
LDL Cholesterol: 114 mg/dL — ABNORMAL HIGH (ref 0–99)
NonHDL: 124.02
Total CHOL/HDL Ratio: 3
Triglycerides: 51 mg/dL (ref 0.0–149.0)
VLDL: 10.2 mg/dL (ref 0.0–40.0)

## 2023-04-20 LAB — HEMOGLOBIN A1C: Hgb A1c MFr Bld: 5.7 % (ref 4.6–6.5)

## 2023-04-20 LAB — URIC ACID: Uric Acid, Serum: 5.2 mg/dL (ref 4.0–7.8)

## 2023-04-20 NOTE — Progress Notes (Signed)
Established Patient Office Visit   Subjective:  Patient ID: Parker Rogers, male    DOB: 08-31-1980  Age: 42 y.o. MRN: [id number]  Chief Complaint  Patient presents with   Medical Management of Chronic Issues    6 month follow up. Pt is fasting.    Foot Pain    Righ foot pain. Pt states he went to podiatry and they diagnosed him with mild arthritis.     Foot Pain Pertinent negatives include no abdominal pain, myalgias, rash or weakness.   Encounter Diagnoses  Name Primary?   Elevated LDL cholesterol level Yes   Screening for diabetes mellitus    Screening for gout    Elevated coronary artery calcium score    For follow-up of above.  Has been on a low-fat low-cholesterol diet and lost 20 pounds over the last several months.  Coronary calcium score was measured at 110.  No family history of coronary artery disease.  Under a podiatrist care for bunion in his right first MTP.   Review of Systems  Constitutional: Negative.   HENT: Negative.    Eyes:  Negative for blurred vision, discharge and redness.  Respiratory: Negative.    Cardiovascular: Negative.   Gastrointestinal:  Negative for abdominal pain.  Genitourinary: Negative.   Musculoskeletal:  Positive for joint pain. Negative for myalgias.  Skin:  Negative for rash.  Neurological:  Negative for tingling, loss of consciousness and weakness.  Endo/Heme/Allergies:  Negative for polydipsia.     Current Outpatient Medications:    Glucosamine HCl (GLUCOSAMINE PO), Take by mouth daily., Disp: , Rfl:    Magnesium 500 MG TABS, Take by mouth., Disp: , Rfl:    Multiple Vitamin (MULTIVITAMIN) tablet, Take 1 tablet by mouth daily., Disp: , Rfl:    naproxen (NAPROSYN) 375 MG tablet, Take 1 tablet (375 mg total) by mouth 2 (two) times daily., Disp: 20 tablet, Rfl: 0   Omega-3 Fatty Acids (FISH OIL) 1000 MG CAPS, Take by mouth daily., Disp: , Rfl:    ondansetron (ZOFRAN) 4 MG tablet, Take 1 tablet (4 mg total) by mouth daily as needed  for nausea or vomiting., Disp: 30 tablet, Rfl: 1   Zinc Sulfate (ZINC 15 PO), Take by mouth., Disp: , Rfl:    HYDROcodone-acetaminophen (NORCO/VICODIN) 5-325 MG tablet, Take 1 tablet by mouth every 6 (six) hours as needed for severe pain. (Patient not taking: Reported on 10/17/2022), Disp: 10 tablet, Rfl: 0   oxyCODONE-acetaminophen (PERCOCET) 5-325 MG tablet, Take 1 tablet by mouth every 4 (four) hours as needed for severe pain. (Patient not taking: Reported on 04/20/2023), Disp: 15 tablet, Rfl: 0   tamsulosin (FLOMAX) 0.4 MG CAPS capsule, Take 1 capsule (0.4 mg total) by mouth daily. (Patient not taking: Reported on 04/20/2023), Disp: 7 capsule, Rfl: 0   Objective:     BP 102/74   Pulse 80   Temp 98.7 F (37.1 C)   Ht 5\' 6"  (1.676 m)   Wt 148 lb 3.2 oz (67.2 kg)   SpO2 100%   BMI 23.92 kg/m  Wt Readings from Last 3 Encounters:  04/20/23 148 lb 3.2 oz (67.2 kg)  11/28/22 155 lb 8 oz (70.5 kg)  10/17/22 165 lb 4 oz (75 kg)      Physical Exam Constitutional:      General: He is not in acute distress.    Appearance: Normal appearance. He is not ill-appearing, toxic-appearing or diaphoretic.  HENT:     Head: Normocephalic and atraumatic.  Right Ear: External ear normal.     Left Ear: External ear normal.  Eyes:     General: No scleral icterus.       Right eye: No discharge.        Left eye: No discharge.     Extraocular Movements: Extraocular movements intact.     Conjunctiva/sclera: Conjunctivae normal.  Pulmonary:     Effort: Pulmonary effort is normal. No respiratory distress.  Skin:    General: Skin is warm and dry.  Neurological:     Mental Status: He is alert and oriented to person, place, and time.  Psychiatric:        Mood and Affect: Mood normal.        Behavior: Behavior normal.      No results found for any visits on 04/20/23.    The 10-year ASCVD risk score (Arnett DK, et al., 2019) is: 1.5%    Assessment & Plan:   Elevated LDL cholesterol  level -     Lipid panel  Screening for diabetes mellitus -     Basic metabolic panel -     Hemoglobin A1c  Screening for gout -     Uric acid  Elevated coronary artery calcium score    Return in about 6 months (around 10/19/2023).    Mliss Sax, MD

## 2023-10-19 ENCOUNTER — Ambulatory Visit: Payer: BC Managed Care – PPO | Admitting: Family Medicine

## 2023-12-03 ENCOUNTER — Other Ambulatory Visit: Payer: Self-pay | Admitting: Student

## 2023-12-03 DIAGNOSIS — R109 Unspecified abdominal pain: Secondary | ICD-10-CM

## 2023-12-03 DIAGNOSIS — R194 Change in bowel habit: Secondary | ICD-10-CM | POA: Diagnosis not present

## 2023-12-10 ENCOUNTER — Other Ambulatory Visit: Payer: Self-pay

## 2023-12-10 DIAGNOSIS — D125 Benign neoplasm of sigmoid colon: Secondary | ICD-10-CM | POA: Diagnosis not present

## 2023-12-10 DIAGNOSIS — K648 Other hemorrhoids: Secondary | ICD-10-CM | POA: Diagnosis not present

## 2023-12-10 DIAGNOSIS — R194 Change in bowel habit: Secondary | ICD-10-CM | POA: Diagnosis not present

## 2023-12-14 ENCOUNTER — Other Ambulatory Visit: Payer: Self-pay

## 2023-12-16 ENCOUNTER — Ambulatory Visit
Admission: RE | Admit: 2023-12-16 | Discharge: 2023-12-16 | Discharge status: Home / Self Care | Payer: Self-pay | Source: Ambulatory Visit | Attending: Student

## 2023-12-16 DIAGNOSIS — R109 Unspecified abdominal pain: Secondary | ICD-10-CM

## 2024-03-04 DIAGNOSIS — R109 Unspecified abdominal pain: Secondary | ICD-10-CM | POA: Diagnosis not present

## 2024-03-04 DIAGNOSIS — Z86018 Personal history of other benign neoplasm: Secondary | ICD-10-CM | POA: Diagnosis not present

## 2024-03-31 ENCOUNTER — Ambulatory Visit: Payer: Self-pay | Admitting: Family Medicine

## 2024-03-31 ENCOUNTER — Ambulatory Visit (INDEPENDENT_AMBULATORY_CARE_PROVIDER_SITE_OTHER): Payer: Self-pay | Admitting: Family Medicine

## 2024-03-31 ENCOUNTER — Encounter: Payer: Self-pay | Admitting: Family Medicine

## 2024-03-31 VITALS — BP 116/72 | HR 64 | Temp 97.3°F | Ht 66.0 in | Wt 150.8 lb

## 2024-03-31 DIAGNOSIS — Z131 Encounter for screening for diabetes mellitus: Secondary | ICD-10-CM | POA: Diagnosis not present

## 2024-03-31 DIAGNOSIS — Z87898 Personal history of other specified conditions: Secondary | ICD-10-CM | POA: Diagnosis not present

## 2024-03-31 DIAGNOSIS — R931 Abnormal findings on diagnostic imaging of heart and coronary circulation: Secondary | ICD-10-CM | POA: Diagnosis not present

## 2024-03-31 DIAGNOSIS — Z566 Other physical and mental strain related to work: Secondary | ICD-10-CM

## 2024-03-31 DIAGNOSIS — R0683 Snoring: Secondary | ICD-10-CM

## 2024-03-31 LAB — CBC WITH DIFFERENTIAL/PLATELET
Basophils Absolute: 0.1 10*3/uL (ref 0.0–0.1)
Basophils Relative: 1.4 % (ref 0.0–3.0)
Eosinophils Absolute: 0.2 10*3/uL (ref 0.0–0.7)
Eosinophils Relative: 3.4 % (ref 0.0–5.0)
HCT: 45.9 % (ref 39.0–52.0)
Hemoglobin: 15 g/dL (ref 13.0–17.0)
Lymphocytes Relative: 35.1 % (ref 12.0–46.0)
Lymphs Abs: 1.7 10*3/uL (ref 0.7–4.0)
MCHC: 32.7 g/dL (ref 30.0–36.0)
MCV: 84.3 fl (ref 78.0–100.0)
Monocytes Absolute: 0.4 10*3/uL (ref 0.1–1.0)
Monocytes Relative: 8.9 % (ref 3.0–12.0)
Neutro Abs: 2.4 10*3/uL (ref 1.4–7.7)
Neutrophils Relative %: 51.2 % (ref 43.0–77.0)
Platelets: 208 10*3/uL (ref 150.0–400.0)
RBC: 5.44 Mil/uL (ref 4.22–5.81)
RDW: 13.5 % (ref 11.5–15.5)
WBC: 4.7 10*3/uL (ref 4.0–10.5)

## 2024-03-31 LAB — COMPREHENSIVE METABOLIC PANEL WITH GFR
ALT: 31 U/L (ref 0–53)
AST: 29 U/L (ref 0–37)
Albumin: 4.6 g/dL (ref 3.5–5.2)
Alkaline Phosphatase: 49 U/L (ref 39–117)
BUN: 20 mg/dL (ref 6–23)
CO2: 30 meq/L (ref 19–32)
Calcium: 9.4 mg/dL (ref 8.4–10.5)
Chloride: 102 meq/L (ref 96–112)
Creatinine, Ser: 1.1 mg/dL (ref 0.40–1.50)
GFR: 81.99 mL/min
Glucose, Bld: 95 mg/dL (ref 70–99)
Potassium: 3.9 meq/L (ref 3.5–5.1)
Sodium: 140 meq/L (ref 135–145)
Total Bilirubin: 0.6 mg/dL (ref 0.2–1.2)
Total Protein: 7.1 g/dL (ref 6.0–8.3)

## 2024-03-31 LAB — LIPID PANEL
Cholesterol: 231 mg/dL — ABNORMAL HIGH (ref 0–200)
HDL: 65.8 mg/dL
LDL Cholesterol: 152 mg/dL — ABNORMAL HIGH (ref 0–99)
NonHDL: 165.66
Total CHOL/HDL Ratio: 4
Triglycerides: 68 mg/dL (ref 0.0–149.0)
VLDL: 13.6 mg/dL (ref 0.0–40.0)

## 2024-03-31 LAB — HEMOGLOBIN A1C: Hgb A1c MFr Bld: 5.7 % (ref 4.6–6.5)

## 2024-03-31 LAB — PSA: PSA: 0.75 ng/mL (ref 0.10–4.00)

## 2024-03-31 NOTE — Progress Notes (Signed)
 Established Patient Office Visit   Subjective:  Patient ID: Parker Rogers, male    DOB: 05-06-1981  Age: 43 y.o. MRN: [id number]  Chief Complaint  Patient presents with   Medical Management of Chronic Issues    Follow up. Pt is not fasting.     HPI Encounter Diagnoses  Name Primary?   Elevated coronary artery calcium score Yes   Screening for diabetes mellitus    Snores    History of nocturia    Stress at work    For follow-up of above.  No family history of coronary artery disease.  He is aware of his elevated calcium score.  He  consumes a healthy diet and exercises 4-5 times weekly.  He does not smoke.  There is stress at work and he believes that this may be affecting his sleep. His wife says that he snores. Nocturia x 1.  No history of prostate disease in his family.     Review of Systems  Constitutional: Negative.   HENT: Negative.    Eyes:  Negative for blurred vision, discharge and redness.  Respiratory: Negative.    Cardiovascular: Negative.   Gastrointestinal:  Negative for abdominal pain.  Genitourinary: Negative.   Musculoskeletal: Negative.  Negative for myalgias.  Skin:  Negative for rash.  Neurological:  Negative for tingling, loss of consciousness and weakness.  Endo/Heme/Allergies:  Negative for polydipsia.     Current Outpatient Medications:    Glucosamine HCl (GLUCOSAMINE PO), Take by mouth daily., Disp: , Rfl:    Magnesium 500 MG TABS, Take by mouth., Disp: , Rfl:    Multiple Vitamin (MULTIVITAMIN) tablet, Take 1 tablet by mouth daily., Disp: , Rfl:    naproxen  (NAPROSYN ) 375 MG tablet, Take 1 tablet (375 mg total) by mouth 2 (two) times daily., Disp: 20 tablet, Rfl: 0   Omega-3 Fatty Acids (FISH OIL) 1000 MG CAPS, Take by mouth daily., Disp: , Rfl:    Zinc Sulfate (ZINC 15 PO), Take by mouth., Disp: , Rfl:    tamsulosin  (FLOMAX ) 0.4 MG CAPS capsule, Take 1 capsule (0.4 mg total) by mouth daily. (Patient not taking: Reported on 03/31/2024), Disp: 7  capsule, Rfl: 0   Objective:     BP 116/72 (BP Location: Right Arm, Patient Position: Sitting, Cuff Size: Normal)   Pulse 64   Temp (!) 97.3 F (36.3 C) (Temporal)   Ht 5' 6 (1.676 m)   Wt 150 lb 12.8 oz (68.4 kg)   SpO2 99%   BMI 24.34 kg/m    Physical Exam Constitutional:      General: He is not in acute distress.    Appearance: Normal appearance. He is not ill-appearing, toxic-appearing or diaphoretic.  HENT:     Head: Normocephalic and atraumatic.     Right Ear: Tympanic membrane, ear canal and external ear normal.     Left Ear: Tympanic membrane, ear canal and external ear normal.     Mouth/Throat:     Mouth: Mucous membranes are moist.     Pharynx: Oropharynx is clear. No oropharyngeal exudate or posterior oropharyngeal erythema.   Eyes:     General: No scleral icterus.       Right eye: No discharge.        Left eye: No discharge.     Extraocular Movements: Extraocular movements intact.     Conjunctiva/sclera: Conjunctivae normal.     Pupils: Pupils are equal, round, and reactive to light.  Cardiovascular:     Rate and Rhythm:  Normal rate and regular rhythm.  Pulmonary:     Effort: Pulmonary effort is normal. No respiratory distress.     Breath sounds: Normal breath sounds.  Abdominal:     General: Bowel sounds are normal.     Tenderness: There is no abdominal tenderness. There is no guarding.  Musculoskeletal:     Cervical back: No rigidity or tenderness.  Skin:    General: Skin is warm and dry.  Neurological:     Mental Status: He is alert and oriented to person, place, and time.  Psychiatric:        Mood and Affect: Mood normal.        Behavior: Behavior normal.      No results found for any visits on 03/31/24.    The 10-year ASCVD risk score (Arnett DK, et al., 2019) is: 0.9%    Assessment & Plan:   Elevated coronary artery calcium score -     CBC with Differential/Platelet -     Comprehensive metabolic panel with GFR -     Lipid  panel  Screening for diabetes mellitus -     Comprehensive metabolic panel with GFR -     Hemoglobin A1c  Snores -     Pulmonary Visit  History of nocturia -     PSA  Stress at work    Return in about 6 months (around 09/29/2024), or if symptoms worsen or fail to improve.  With his elevated coronary artery calcium score, I recommended a statin.  He will consider it.  Information was given on preventing coronary artery disease and preventing hyperlipidemia.  Information was given on preventing type 2 diabetes.  Information was given on sleep hygiene.  Elsie Sim Lent, MD

## 2024-04-01 MED ORDER — ATORVASTATIN CALCIUM 10 MG PO TABS
10.0000 mg | ORAL_TABLET | Freq: Every day | ORAL | 3 refills | Status: AC
Start: 2024-04-01 — End: ?

## 2024-09-29 ENCOUNTER — Ambulatory Visit: Admitting: Family Medicine
